# Patient Record
Sex: Male | Born: 1935 | Race: White | Hispanic: No | Marital: Married | Smoking: Former smoker
Health system: Southern US, Community
[De-identification: ages and names within clinical notes are randomized; demographics above are authoritative.]

## PROBLEM LIST (undated history)

## (undated) DIAGNOSIS — E785 Hyperlipidemia, unspecified: Secondary | ICD-10-CM

## (undated) DIAGNOSIS — I4891 Unspecified atrial fibrillation: Secondary | ICD-10-CM

## (undated) DIAGNOSIS — I10 Essential (primary) hypertension: Secondary | ICD-10-CM

## (undated) DIAGNOSIS — N186 End stage renal disease: Secondary | ICD-10-CM

## (undated) DIAGNOSIS — D649 Anemia, unspecified: Secondary | ICD-10-CM

## (undated) DIAGNOSIS — Z992 Dependence on renal dialysis: Secondary | ICD-10-CM

## (undated) DIAGNOSIS — C801 Malignant (primary) neoplasm, unspecified: Secondary | ICD-10-CM

## (undated) DIAGNOSIS — Z87442 Personal history of urinary calculi: Secondary | ICD-10-CM

## (undated) DIAGNOSIS — Z950 Presence of cardiac pacemaker: Secondary | ICD-10-CM

## (undated) HISTORY — DX: Malignant (primary) neoplasm, unspecified: C80.1

## (undated) HISTORY — DX: Presence of cardiac pacemaker: Z95.0

## (undated) HISTORY — DX: Hyperlipidemia, unspecified: E78.5

## (undated) HISTORY — DX: Unspecified atrial fibrillation: I48.91

## (undated) HISTORY — DX: Essential (primary) hypertension: I10

## (undated) HISTORY — PX: PERMANENT PACEMAKER INSERTION: SHX6023

## (undated) HISTORY — DX: Anemia, unspecified: D64.9

## (undated) HISTORY — PX: TONSILLECTOMY: SUR1361

## (undated) HISTORY — PX: APPENDECTOMY: SHX54

## (undated) HISTORY — PX: INSERT / REPLACE / REMOVE PACEMAKER: SUR710

## (undated) HISTORY — PX: CHOLECYSTECTOMY: SHX55

## (undated) HISTORY — PX: BLADDER REMOVAL: SHX567

## (undated) HISTORY — PX: TONSILLECTOMY: SHX5217

## (undated) HISTORY — PX: REVISION OF SCAR ON FACE/HEAD: SHX2349

---

## 2014-09-19 ENCOUNTER — Other Ambulatory Visit: Payer: Self-pay

## 2014-09-19 ENCOUNTER — Encounter: Payer: Self-pay | Admitting: Vascular Surgery

## 2014-09-19 DIAGNOSIS — N185 Chronic kidney disease, stage 5: Secondary | ICD-10-CM

## 2014-09-19 DIAGNOSIS — Z01810 Encounter for preprocedural cardiovascular examination: Secondary | ICD-10-CM

## 2014-09-23 ENCOUNTER — Encounter: Payer: Self-pay | Admitting: Vascular Surgery

## 2014-09-24 ENCOUNTER — Encounter: Payer: Self-pay | Admitting: Vascular Surgery

## 2014-09-25 ENCOUNTER — Ambulatory Visit (INDEPENDENT_AMBULATORY_CARE_PROVIDER_SITE_OTHER): Payer: Medicare HMO | Admitting: Vascular Surgery

## 2014-09-25 ENCOUNTER — Encounter: Payer: Self-pay | Admitting: Vascular Surgery

## 2014-09-25 ENCOUNTER — Ambulatory Visit (INDEPENDENT_AMBULATORY_CARE_PROVIDER_SITE_OTHER)
Admission: RE | Admit: 2014-09-25 | Discharge: 2014-09-25 | Disposition: A | Discharge status: Home / Self Care | Payer: Medicare HMO | Source: Ambulatory Visit | Attending: Vascular Surgery | Admitting: Vascular Surgery

## 2014-09-25 ENCOUNTER — Ambulatory Visit (HOSPITAL_COMMUNITY)
Admission: RE | Admit: 2014-09-25 | Discharge: 2014-09-25 | Disposition: A | Discharge status: Home / Self Care | Payer: Medicare HMO | Source: Ambulatory Visit | Attending: Vascular Surgery | Admitting: Vascular Surgery

## 2014-09-25 VITALS — BP 146/82 | HR 79 | Ht 67.0 in | Wt 171.0 lb

## 2014-09-25 DIAGNOSIS — Z01810 Encounter for preprocedural cardiovascular examination: Secondary | ICD-10-CM

## 2014-09-25 DIAGNOSIS — N185 Chronic kidney disease, stage 5: Secondary | ICD-10-CM | POA: Diagnosis not present

## 2014-09-25 NOTE — Progress Notes (Signed)
[facility] [place] HISTORY AND PHYSICAL   History of Present Illness:  Patient is a 81 y.o. year old male who presents for placement of a permanent hemodialysis access. The patient is right handed.  The patient is not currently on hemodialysis.  The cause of renal failure is thought to be secondary to diabetes and hypertension.  Other chronic medical problems include history of a left-sided pacemaker, hyperlipidemia, atrial fibrillation all of which is currently stable.  Past Medical History  Diagnosis Date  . Chronic kidney disease   . Hypertension   . Anemia   . Pacemaker   . Cancer     bladder -   . Hyperlipidemia   . Nephrolithiasis   . Atrial fibrillation     Past Surgical History  Procedure Laterality Date  . Tonsillectomy    . Bladder removal    . Permanent pacemaker insertion       Social History History  Substance Use Topics  . Smoking status: Former Smoker    Quit date: 04/19/1980  . Smokeless tobacco: Not on file  . Alcohol Use: 0.0 oz/week    0 Standard drinks or equivalent per week    Family History Family History  Problem Relation Age of Onset  . Stroke Mother   . Hypertension Mother   . Varicose Veins Mother   . Heart disease Mother   . Heart attack Father     Allergies  No Known Allergies   Current Outpatient Prescriptions  Medication Sig Dispense Refill  . amLODipine (NORVASC) 10 MG tablet Take 10 mg by mouth daily.    Marland Kitchen aspirin EC 81 MG tablet Take 81 mg by mouth daily.    Marland Kitchen atorvastatin (LIPITOR) 10 MG tablet Take 10 mg by mouth daily.    . calcitRIOL (ROCALTROL) 0.25 MCG capsule Take 0.25 mcg by mouth daily.    . darbepoetin (ARANESP) 60 MCG/0.3ML SOLN injection Inject 60 mcg into the skin every 7 (seven) days.    . furosemide (LASIX) 40 MG tablet Take 40 mg by mouth daily.    . metolazone (ZAROXOLYN) 5 MG tablet Take 5 mg by mouth daily.    . metoprolol succinate (TOPROL-XL) 25 MG 24 hr tablet Take 25 mg by mouth  2 (two) times daily.    . sodium bicarbonate 650 MG tablet Take 650 mg by mouth 3 (three) times daily.    . temazepam (RESTORIL) 15 MG capsule Take 15 mg by mouth at bedtime as needed for sleep.    Marland Kitchen triamcinolone cream (KENALOG) 0.1 % Apply 1 application topically 2 (two) times daily.     No current facility-administered medications for this visit.    ROS:   General:  No weight loss, Fever, chills  HEENT: No recent headaches, no nasal bleeding, no visual changes, no sore throat  Neurologic: No dizziness, blackouts, seizures. No recent symptoms of stroke or mini- stroke. No recent episodes of slurred speech, or temporary blindness.  Cardiac: No recent episodes of chest pain/pressure, no shortness of breath at rest.  + shortness of breath with exertion.  Denies history of atrial fibrillation or irregular heartbeat  Vascular: No history of rest pain in feet.  No history of claudication.  No history of non-healing ulcer, No history of DVT   Pulmonary: No home oxygen, no productive cough, no hemoptysis,  No asthma or wheezing  Musculoskeletal:  [ ]  Arthritis, [ ]  Low back pain,  [ ]  Joint pain  Hematologic:No history of hypercoagulable state.  No history of easy bleeding.  No history of anemia  Gastrointestinal: No hematochezia or melena,  No gastroesophageal reflux, no trouble swallowing  Urinary: [ x] chronic Kidney disease, [ ]  on HD - [ ]  MWF or [ ]  TTHS, [ ]  Burning with urination, [ ]  Frequent urination, [ ]  Difficulty urinating;   Skin: No rashes  Psychological: No history of anxiety,  No history of depression   Physical Examination  Filed Vitals:   09/25/14 1220  BP: 146/82  Pulse: 79  Height: 5\' 7"  (1.702 m)  Weight: 77.565 kg (171 lb)  SpO2: 97%    Body mass index is 26.78 kg/(m^2).  General:  Alert and oriented, no acute distress, patient smells uremic HEENT: Normal Neck: No bruit or JVD Pulmonary: Clear to auscultation bilaterally Cardiac: Regular Rate and  Rhythm without murmur Gastrointestinal: Soft, non-tender, non-distended, no mass Skin: No rash Extremity Pulses:  2+ radial, brachial pulses bilaterally Musculoskeletal: No deformity or edema  Neurologic: Upper and lower extremity motor 5/5 and symmetric  DATA: Vein mapping ultrasound was performed today I reviewed and interpreted this study. Patient has very small basilic and cephalic veins on the right side not usable for a fistula. The cephalic and basilic veins in the left side are also fairly marginal.   ASSESSMENT: Patient with CK D5. He needs long-term hemodialysis access. Since he has a pacemaker on the left side even though he may have a marginal vein on that side that could possibly be used for fistula more he may develop venous hypertension due to his left-sided pacemaker.. Believe the best option at this point would be to place a right arm AV graft. This is scheduled for 10/01/2014. Risks benefits possible complications and procedure details were expanded the patient today including but not limited to bleeding infection graft thrombosis ischemic steal. He understands and agrees to proceed.   PLAN:  See above  Ruta Hinds, MD [facility] [place] Office: [phone] Pager: [phone]

## 2014-09-26 ENCOUNTER — Other Ambulatory Visit: Payer: Self-pay

## 2014-09-27 ENCOUNTER — Encounter: Payer: Self-pay | Admitting: Nephrology

## 2014-09-27 ENCOUNTER — Encounter (HOSPITAL_COMMUNITY): Payer: Medicare HMO

## 2014-09-30 ENCOUNTER — Encounter (HOSPITAL_COMMUNITY): Payer: Self-pay | Admitting: *Deleted

## 2014-09-30 NOTE — Progress Notes (Signed)
   09/30/14 1303  OBSTRUCTIVE SLEEP APNEA  Have you ever been diagnosed with sleep apnea through a sleep study? No  Do you snore loudly (loud enough to be heard through closed doors)?  0  Do you often feel tired, fatigued, or sleepy during the daytime? 0  Has anyone observed you stop breathing during your sleep? 0  Do you have, or are you being treated for high blood pressure? 1  BMI more than 35 kg/m2? 0  Age over 80 years old? 1  Neck circumference greater than 40 cm/16 inches? 1  Gender: 1  Obstructive Sleep Apnea Score 4

## 2014-10-01 ENCOUNTER — Ambulatory Visit (HOSPITAL_COMMUNITY): Payer: Medicare HMO | Admitting: Certified Registered"

## 2014-10-01 ENCOUNTER — Encounter (HOSPITAL_COMMUNITY)
Admission: RE | Disposition: A | Discharge status: Home / Self Care | Payer: Self-pay | Source: Ambulatory Visit | Attending: Vascular Surgery

## 2014-10-01 ENCOUNTER — Telehealth: Payer: Self-pay | Admitting: Vascular Surgery

## 2014-10-01 ENCOUNTER — Ambulatory Visit (HOSPITAL_COMMUNITY)
Admission: RE | Admit: 2014-10-01 | Discharge: 2014-10-01 | Disposition: A | Discharge status: Home / Self Care | Payer: Medicare HMO | Source: Ambulatory Visit | Attending: Vascular Surgery | Admitting: Vascular Surgery

## 2014-10-01 ENCOUNTER — Encounter (HOSPITAL_COMMUNITY): Payer: Self-pay | Admitting: Certified Registered"

## 2014-10-01 DIAGNOSIS — I4891 Unspecified atrial fibrillation: Secondary | ICD-10-CM | POA: Diagnosis not present

## 2014-10-01 DIAGNOSIS — E785 Hyperlipidemia, unspecified: Secondary | ICD-10-CM | POA: Diagnosis not present

## 2014-10-01 DIAGNOSIS — I509 Heart failure, unspecified: Secondary | ICD-10-CM | POA: Insufficient documentation

## 2014-10-01 DIAGNOSIS — I120 Hypertensive chronic kidney disease with stage 5 chronic kidney disease or end stage renal disease: Secondary | ICD-10-CM | POA: Diagnosis not present

## 2014-10-01 DIAGNOSIS — N186 End stage renal disease: Secondary | ICD-10-CM | POA: Insufficient documentation

## 2014-10-01 DIAGNOSIS — Z7982 Long term (current) use of aspirin: Secondary | ICD-10-CM | POA: Insufficient documentation

## 2014-10-01 DIAGNOSIS — N185 Chronic kidney disease, stage 5: Secondary | ICD-10-CM | POA: Diagnosis not present

## 2014-10-01 DIAGNOSIS — Z7952 Long term (current) use of systemic steroids: Secondary | ICD-10-CM | POA: Insufficient documentation

## 2014-10-01 DIAGNOSIS — Z87891 Personal history of nicotine dependence: Secondary | ICD-10-CM | POA: Insufficient documentation

## 2014-10-01 DIAGNOSIS — I252 Old myocardial infarction: Secondary | ICD-10-CM | POA: Insufficient documentation

## 2014-10-01 DIAGNOSIS — Z79899 Other long term (current) drug therapy: Secondary | ICD-10-CM | POA: Insufficient documentation

## 2014-10-01 DIAGNOSIS — Z8551 Personal history of malignant neoplasm of bladder: Secondary | ICD-10-CM | POA: Insufficient documentation

## 2014-10-01 DIAGNOSIS — Z950 Presence of cardiac pacemaker: Secondary | ICD-10-CM | POA: Insufficient documentation

## 2014-10-01 HISTORY — PX: AV FISTULA PLACEMENT: SHX1204

## 2014-10-01 LAB — POCT I-STAT 4, (NA,K, GLUC, HGB,HCT)
Glucose, Bld: 113 mg/dL — ABNORMAL HIGH (ref 65–99)
HCT: 29 % — ABNORMAL LOW (ref 39.0–52.0)
Hemoglobin: 9.9 g/dL — ABNORMAL LOW (ref 13.0–17.0)
Potassium: 4.1 mmol/L (ref 3.5–5.1)
SODIUM: 141 mmol/L (ref 135–145)

## 2014-10-01 SURGERY — INSERTION OF ARTERIOVENOUS (AV) GORE-TEX GRAFT ARM
Anesthesia: Monitor Anesthesia Care | Site: Arm Upper | Laterality: Right

## 2014-10-01 MED ORDER — SODIUM CHLORIDE 0.9 % IV SOLN
INTRAVENOUS | Status: DC
  Administered 2014-10-01 (×2): via INTRAVENOUS

## 2014-10-01 MED ORDER — LIDOCAINE HCL (PF) 1 % IJ SOLN
INTRAMUSCULAR | Status: AC
  Filled 2014-10-01: qty 30

## 2014-10-01 MED ORDER — FENTANYL CITRATE (PF) 100 MCG/2ML IJ SOLN
INTRAMUSCULAR | Status: AC
  Filled 2014-10-01: qty 2

## 2014-10-01 MED ORDER — THROMBIN 20000 UNITS EX SOLR
CUTANEOUS | Status: AC
  Filled 2014-10-01: qty 20000

## 2014-10-01 MED ORDER — OXYCODONE HCL 5 MG/5ML PO SOLN: 5.0000 mg | Freq: Once | ORAL | Status: DC | PRN

## 2014-10-01 MED ORDER — FENTANYL CITRATE (PF) 100 MCG/2ML IJ SOLN
25.0000 ug | INTRAMUSCULAR | Status: DC | PRN
  Administered 2014-10-01: 25 ug via INTRAVENOUS

## 2014-10-01 MED ORDER — DEXTROSE 5 % IV SOLN
1.5000 g | INTRAVENOUS | Status: AC
  Administered 2014-10-01: 1.5 g via INTRAVENOUS

## 2014-10-01 MED ORDER — CHLORHEXIDINE GLUCONATE CLOTH 2 % EX PADS: 6.0000 | MEDICATED_PAD | Freq: Once | CUTANEOUS | Status: DC

## 2014-10-01 MED ORDER — METOPROLOL TARTRATE 25 MG PO TABS
25.0000 mg | ORAL_TABLET | Freq: Once | ORAL | Status: AC
  Administered 2014-10-01: 25 mg via ORAL
  Filled 2014-10-01: qty 1

## 2014-10-01 MED ORDER — FENTANYL CITRATE (PF) 250 MCG/5ML IJ SOLN
INTRAMUSCULAR | Status: AC
  Filled 2014-10-01: qty 5

## 2014-10-01 MED ORDER — PROPOFOL INFUSION 10 MG/ML OPTIME
INTRAVENOUS | Status: DC | PRN
  Administered 2014-10-01: 75 ug/kg/min via INTRAVENOUS

## 2014-10-01 MED ORDER — FENTANYL CITRATE (PF) 100 MCG/2ML IJ SOLN
INTRAMUSCULAR | Status: DC | PRN
  Administered 2014-10-01: 50 ug via INTRAVENOUS

## 2014-10-01 MED ORDER — THROMBIN 20000 UNITS EX SOLR
CUTANEOUS | Status: DC | PRN
  Administered 2014-10-01: 15:00:00 via TOPICAL

## 2014-10-01 MED ORDER — LIDOCAINE HCL (PF) 1 % IJ SOLN
INTRAMUSCULAR | Status: DC | PRN
  Administered 2014-10-01: 15 mL

## 2014-10-01 MED ORDER — PROPOFOL 10 MG/ML IV BOLUS
INTRAVENOUS | Status: AC
  Filled 2014-10-01: qty 20

## 2014-10-01 MED ORDER — DEXTROSE 5 % IV SOLN
INTRAVENOUS | Status: AC
  Filled 2014-10-01: qty 1.5

## 2014-10-01 MED ORDER — OXYCODONE-ACETAMINOPHEN 5-325 MG PO TABS: 1.0000 | ORAL_TABLET | Freq: Four times a day (QID) | ORAL | Status: DC | PRN

## 2014-10-01 MED ORDER — OXYCODONE HCL 5 MG PO TABS: 5.0000 mg | ORAL_TABLET | Freq: Once | ORAL | Status: DC | PRN

## 2014-10-01 MED ORDER — HEPARIN SODIUM (PORCINE) 1000 UNIT/ML IJ SOLN
INTRAMUSCULAR | Status: DC | PRN
  Administered 2014-10-01: 5000 [IU] via INTRAVENOUS

## 2014-10-01 MED ORDER — ROCURONIUM BROMIDE 50 MG/5ML IV SOLN
INTRAVENOUS | Status: AC
  Filled 2014-10-01: qty 1

## 2014-10-01 MED ORDER — METOPROLOL TARTRATE 12.5 MG HALF TABLET
ORAL_TABLET | ORAL | Status: AC
  Filled 2014-10-01: qty 2

## 2014-10-01 MED ORDER — PROTAMINE SULFATE 10 MG/ML IV SOLN
INTRAVENOUS | Status: DC | PRN
  Administered 2014-10-01 (×2): 20 mg via INTRAVENOUS
  Administered 2014-10-01: 10 mg via INTRAVENOUS

## 2014-10-01 MED ORDER — FENTANYL CITRATE (PF) 100 MCG/2ML IJ SOLN: 25.0000 ug | INTRAMUSCULAR | Status: DC | PRN

## 2014-10-01 MED ORDER — SODIUM CHLORIDE 0.9 % IR SOLN
Status: DC | PRN
  Administered 2014-10-01: 500 mL

## 2014-10-01 MED ORDER — 0.9 % SODIUM CHLORIDE (POUR BTL) OPTIME
TOPICAL | Status: DC | PRN
  Administered 2014-10-01: 1000 mL

## 2014-10-01 SURGICAL SUPPLY — 39 items
ARMBAND PINK RESTRICT EXTREMIT (MISCELLANEOUS) ×2 IMPLANT
CANISTER SUCTION 2500CC (MISCELLANEOUS) ×2 IMPLANT
CANNULA VESSEL 3MM 2 BLNT TIP (CANNULA) IMPLANT
CLIP TI MEDIUM 6 (CLIP) ×2 IMPLANT
CLIP TI WIDE RED SMALL 6 (CLIP) ×2 IMPLANT
DECANTER SPIKE VIAL GLASS SM (MISCELLANEOUS) ×2 IMPLANT
ELECT REM PT RETURN 9FT ADLT (ELECTROSURGICAL) ×2
ELECTRODE REM PT RTRN 9FT ADLT (ELECTROSURGICAL) ×1 IMPLANT
GAUZE SPONGE 4X4 16PLY XRAY LF (GAUZE/BANDAGES/DRESSINGS) ×2 IMPLANT
GEL ULTRASOUND 20GR AQUASONIC (MISCELLANEOUS) IMPLANT
GLOVE BIO SURGEON STRL SZ7.5 (GLOVE) ×2 IMPLANT
GLOVE BIOGEL PI IND STRL 7.0 (GLOVE) ×1 IMPLANT
GLOVE BIOGEL PI IND STRL 7.5 (GLOVE) ×1 IMPLANT
GLOVE BIOGEL PI INDICATOR 7.0 (GLOVE) ×1
GLOVE BIOGEL PI INDICATOR 7.5 (GLOVE) ×1
GLOVE ECLIPSE 6.5 STRL STRAW (GLOVE) ×2 IMPLANT
GLOVE ECLIPSE 7.0 STRL STRAW (GLOVE) ×2 IMPLANT
GLOVE SURG SS PI 7.0 STRL IVOR (GLOVE) ×2 IMPLANT
GOWN STRL REUS W/ TWL LRG LVL3 (GOWN DISPOSABLE) ×3 IMPLANT
GOWN STRL REUS W/ TWL XL LVL3 (GOWN DISPOSABLE) ×1 IMPLANT
GOWN STRL REUS W/TWL LRG LVL3 (GOWN DISPOSABLE) ×3
GOWN STRL REUS W/TWL XL LVL3 (GOWN DISPOSABLE) ×1
GRAFT GORETEX 6X40 (Vascular Products) ×2 IMPLANT
KIT BASIN OR (CUSTOM PROCEDURE TRAY) ×2 IMPLANT
KIT ROOM TURNOVER OR (KITS) ×2 IMPLANT
LIQUID BAND (GAUZE/BANDAGES/DRESSINGS) ×2 IMPLANT
LOOP VESSEL MINI RED (MISCELLANEOUS) IMPLANT
NS IRRIG 1000ML POUR BTL (IV SOLUTION) ×2 IMPLANT
PACK CV ACCESS (CUSTOM PROCEDURE TRAY) ×2 IMPLANT
PAD ARMBOARD 7.5X6 YLW CONV (MISCELLANEOUS) ×4 IMPLANT
SPONGE SURGIFOAM ABS GEL 100 (HEMOSTASIS) ×2 IMPLANT
SUT PROLENE 6 0 CC (SUTURE) ×6 IMPLANT
SUT SILK 3 0 (SUTURE) ×1
SUT SILK 3-0 18XBRD TIE 12 (SUTURE) ×1 IMPLANT
SUT VIC AB 3-0 SH 27 (SUTURE) ×2
SUT VIC AB 3-0 SH 27X BRD (SUTURE) ×2 IMPLANT
SUT VICRYL 4-0 PS2 18IN ABS (SUTURE) ×2 IMPLANT
UNDERPAD 30X30 INCONTINENT (UNDERPADS AND DIAPERS) ×2 IMPLANT
WATER STERILE IRR 1000ML POUR (IV SOLUTION) ×2 IMPLANT

## 2014-10-01 NOTE — Transfer of Care (Signed)
Immediate Anesthesia Transfer of Care Note  Patient: Robert Morgan  Procedure(s) Performed: Procedure(s): INSERTION OF ARTERIOVENOUS (AV) GORE-TEX GRAFT RIGHT ARM (Right)  Patient Location: PACU  Anesthesia Type:MAC  Level of Consciousness: awake, alert , oriented and patient cooperative  Airway & Oxygen Therapy: Patient Spontanous Breathing and Patient connected to nasal cannula oxygen  Post-op Assessment: Report given to RN, Post -op Vital signs reviewed and stable and Patient moving all extremities  Post vital signs: Reviewed and stable  Last Vitals:  Filed Vitals:   10/01/14 1541  BP:   Pulse:   Temp: 36.9 C  Resp:     Complications: No apparent anesthesia complications

## 2014-10-01 NOTE — Op Note (Signed)
Procedure: Right Upper Arm AV graft  Preop: ESRD  Postop: ESRD  Anesthesia: Local with IV sedation  Assistant: Gerri Lins, PA-C, Silva Bandy, PA-c  Findings:  6 mm PTFE end to side to axillary vein   Procedure Details: The right upper extremity was prepped and draped in usual sterile fashion.  A longitudinal incision was then made near the antecubital crease the right arm. The incision was carried into the subcutaneous tissues down to level of the brachial artery.  Next the brachial artery was dissected free in the medial portion incision. The artery was  3-4 mm in diameter. The vessel loops were placed proximal and distal to the planned site of arteriotomy. At this point, a longitudinal incision was made in the axilla and carried through the subcutaneous tissues and fascia to expose the axillary vein.  The nerves were protected.  The vein was approximately 4 mm in diameter.  It was dissected free and small side branches ligated with silk ties.  Next, a subcutaneous tunnel was created connecting the upper arm to the lower arm incision in an arcing configuration over the biceps muscle.  A 6 mm PTFE graft was then brought through this subcutaneous tunnel. The patient was given 5000 units of intravenous heparin. After appropriate circulation time, the vessel loops were used to control the artery. A longitudinal opening was made in the left brachial artery.  The end of the graft was beveled and sewn end to side to the artery using a 6 0 prolene.  At completion of the anastomosis the artery was forward bled, backbled and thoroughly flushed.  The anastomosis was secured, vessel loops were released and there was palpable pulse in the graft.  The graft was clamped just above the arterial anastomosis with a fistula clamp. The graft was then pulled taut to length at the axillary incision.  The axillary vein was controlled with a fine bulldog clamp proximally and distally.  The distal end of the graft was then  beveled and sewn end to side to the vein using a running 6 0 prolene.  Just prior to completion of the anastomosis, everything was forward bled, back bled and thoroughly flushed.  The anastomosis was secured and the fistula clamp removed from the proximal graft.  A thrill was immediately palpable in the graft. The patient was given 70 mg of protamine to assist with hemostasis.  After hemostasis was obtained, the subcutaneous tissues were reapproximated using a running 3-0 Vicryl suture. The skin was then closed with a 4 0 Vicryl subcuticular stitch. Dermabond was applied to the skin incisions.  The patient tolerated the procedure well and there were no complications.  Instrument sponge and needle count was correct at the end of the case.  The patient was taken to the recovery room in stable condition.  The patient had a palpable pulse at the end of the case.  Ruta Hinds, MD [facility] [place] Office: [phone] Pager: [phone]

## 2014-10-01 NOTE — Discharge Instructions (Signed)
° ° °  10/01/2014 Robert Morgan [id number] 07-17-1927  Surgeon(s): Elam Dutch, MD  Procedure(s): INSERTION OF ARTERIOVENOUS (AV) GORE-TEX GRAFT RIGHT ARM  x Do not stick graft for 4 weeks

## 2014-10-01 NOTE — Anesthesia Preprocedure Evaluation (Addendum)
Anesthesia Evaluation  Patient identified by MRN, date of birth, ID band Patient awake    Reviewed: Allergy & Precautions, NPO status , Patient's Chart, lab work & pertinent test results, reviewed documented beta blocker date and time   History of Anesthesia Complications Negative for: history of anesthetic complications  Airway Mallampati: III  TM Distance: <3 FB Neck ROM: Limited    Dental  (+) Partial Lower, Partial Upper   Pulmonary neg shortness of breath, neg sleep apnea, neg COPDneg recent URI, former smoker,  breath sounds clear to auscultation        Cardiovascular hypertension, Pt. on medications and Pt. on home beta blockers - angina- Past MI and - CHF + pacemaker Rhythm:Regular     Neuro/Psych negative neurological ROS  negative psych ROS   GI/Hepatic negative GI ROS, Neg liver ROS,   Endo/Other  negative endocrine ROS  Renal/GU CRFRenal disease     Musculoskeletal   Abdominal   Peds  Hematology  (+) anemia ,   Anesthesia Other Findings   Reproductive/Obstetrics                           Anesthesia Physical Anesthesia Plan  ASA: III  Anesthesia Plan: MAC   Post-op Pain Management:    Induction: Intravenous  Airway Management Planned: Nasal Cannula  Additional Equipment: None  Intra-op Plan:   Post-operative Plan:   Informed Consent: I have reviewed the patients History and Physical, chart, labs and discussed the procedure including the risks, benefits and alternatives for the proposed anesthesia with the patient or authorized representative who has indicated his/her understanding and acceptance.   Dental advisory given  Plan Discussed with: CRNA and Surgeon  Anesthesia Plan Comments:         Anesthesia Quick Evaluation

## 2014-10-01 NOTE — H&P (View-Only) (Signed)
[facility] [place] [facility] HISTORY AND PHYSICAL   History of Present Illness:  Patient is a 81 y.o. year old male who presents for placement of a permanent hemodialysis access. The patient is right handed.  The patient is not currently on hemodialysis.  The cause of renal failure is thought to be secondary to diabetes and hypertension.  Other chronic medical problems include history of a left-sided pacemaker, hyperlipidemia, atrial fibrillation all of which is currently stable.  Past Medical History  Diagnosis Date  . Chronic kidney disease   . Hypertension   . Anemia   . Pacemaker   . Cancer     bladder -   . Hyperlipidemia   . Nephrolithiasis   . Atrial fibrillation     Past Surgical History  Procedure Laterality Date  . Tonsillectomy    . Bladder removal    . Permanent pacemaker insertion       Social History History  Substance Use Topics  . Smoking status: Former Smoker    Quit date: 04/19/1980  . Smokeless tobacco: Not on file  . Alcohol Use: 0.0 oz/week    0 Standard drinks or equivalent per week    Family History Family History  Problem Relation Age of Onset  . Stroke Mother   . Hypertension Mother   . Varicose Veins Mother   . Heart disease Mother   . Heart attack Father     Allergies  No Known Allergies   Current Outpatient Prescriptions  Medication Sig Dispense Refill  . amLODipine (NORVASC) 10 MG tablet Take 10 mg by mouth daily.    Marland Kitchen aspirin EC 81 MG tablet Take 81 mg by mouth daily.    Marland Kitchen atorvastatin (LIPITOR) 10 MG tablet Take 10 mg by mouth daily.    . calcitRIOL (ROCALTROL) 0.25 MCG capsule Take 0.25 mcg by mouth daily.    . darbepoetin (ARANESP) 60 MCG/0.3ML SOLN injection Inject 60 mcg into the skin every 7 (seven) days.    . furosemide (LASIX) 40 MG tablet Take 40 mg by mouth daily.    . metolazone (ZAROXOLYN) 5 MG tablet Take 5 mg by mouth daily.    . metoprolol succinate (TOPROL-XL) 25 MG 24 hr tablet Take 25 mg by mouth  2 (two) times daily.    . sodium bicarbonate 650 MG tablet Take 650 mg by mouth 3 (three) times daily.    . temazepam (RESTORIL) 15 MG capsule Take 15 mg by mouth at bedtime as needed for sleep.    Marland Kitchen triamcinolone cream (KENALOG) 0.1 % Apply 1 application topically 2 (two) times daily.     No current facility-administered medications for this visit.    ROS:   General:  No weight loss, Fever, chills  HEENT: No recent headaches, no nasal bleeding, no visual changes, no sore throat  Neurologic: No dizziness, blackouts, seizures. No recent symptoms of stroke or mini- stroke. No recent episodes of slurred speech, or temporary blindness.  Cardiac: No recent episodes of chest pain/pressure, no shortness of breath at rest.  + shortness of breath with exertion.  Denies history of atrial fibrillation or irregular heartbeat  Vascular: No history of rest pain in feet.  No history of claudication.  No history of non-healing ulcer, No history of DVT   Pulmonary: No home oxygen, no productive cough, no hemoptysis,  No asthma or wheezing  Musculoskeletal:  [ ]  Arthritis, [ ]  Low back pain,  [ ]  Joint pain  Hematologic:No history of hypercoagulable state.  No history of easy bleeding.  No history of anemia  Gastrointestinal: No hematochezia or melena,  No gastroesophageal reflux, no trouble swallowing  Urinary: [ x] chronic Kidney disease, [ ]  on HD - [ ]  MWF or [ ]  TTHS, [ ]  Burning with urination, [ ]  Frequent urination, [ ]  Difficulty urinating;   Skin: No rashes  Psychological: No history of anxiety,  No history of depression   Physical Examination  Filed Vitals:   09/25/14 1220  BP: 146/82  Pulse: 79  Height: 5\' 7"  (1.702 m)  Weight: 77.565 kg (171 lb)  SpO2: 97%    Body mass index is 26.78 kg/(m^2).  General:  Alert and oriented, no acute distress, patient smells uremic HEENT: Normal Neck: No bruit or JVD Pulmonary: Clear to auscultation bilaterally Cardiac: Regular Rate and  Rhythm without murmur Gastrointestinal: Soft, non-tender, non-distended, no mass Skin: No rash Extremity Pulses:  2+ radial, brachial pulses bilaterally Musculoskeletal: No deformity or edema  Neurologic: Upper and lower extremity motor 5/5 and symmetric  DATA: Vein mapping ultrasound was performed today I reviewed and interpreted this study. Patient has very small basilic and cephalic veins on the right side not usable for a fistula. The cephalic and basilic veins in the left side are also fairly marginal.   ASSESSMENT: Patient with CK D5. He needs long-term hemodialysis access. Since he has a pacemaker on the left side even though he may have a marginal vein on that side that could possibly be used for fistula more he may develop venous hypertension due to his left-sided pacemaker.. Believe the best option at this point would be to place a right arm AV graft. This is scheduled for 10/01/2014. Risks benefits possible complications and procedure details were expanded the patient today including but not limited to bleeding infection graft thrombosis ischemic steal. He understands and agrees to proceed.   PLAN:  See above  Ruta Hinds, MD [facility] Office: [phone] Pager: [phone]

## 2014-10-01 NOTE — Interval H&P Note (Signed)
History and Physical Interval Note:  10/01/2014 1:16 PM  Robert Morgan  has presented today for surgery, with the diagnosis of End Stage Renal Disease  N18.6  The various methods of treatment have been discussed with the patient and family. After consideration of risks, benefits and other options for treatment, the patient has consented to  Procedure(s): INSERTION OF ARTERIOVENOUS (AV) GORE-TEX GRAFT ARM-RIGHT (Right) as a surgical intervention .  The patient's history has been reviewed, patient examined, no change in status, stable for surgery.  I have reviewed the patient's chart and labs.  Questions were answered to the patient's satisfaction.     Ruta Hinds

## 2014-10-01 NOTE — Telephone Encounter (Addendum)
-----   Message from Mena Goes, RN sent at 10/01/2014  3:41 PM EDT ----- Regarding: Schedule    ----- Message -----    From: Alvia Grove, PA-C    Sent: 10/01/2014   3:25 PM      To: [facility] Charge Pool  S/p right AV graft insertion 10/01/14  F/u with Dr. Oneida Alar in 2 weeks  Thanks Maudie Mercury  l/v/m notifying patient of post op appt. with dr. Oneida Alar on 10-24-14 at 1pm

## 2014-10-02 ENCOUNTER — Encounter (HOSPITAL_COMMUNITY): Payer: Self-pay | Admitting: Vascular Surgery

## 2014-10-02 NOTE — Anesthesia Postprocedure Evaluation (Signed)
  Anesthesia Post-op Note  Patient: Robert Morgan  Procedure(s) Performed: Procedure(s): INSERTION OF ARTERIOVENOUS (AV) GORE-TEX GRAFT RIGHT ARM (Right)  Patient Location: PACU  Anesthesia Type:MAC  Level of Consciousness: awake  Airway and Oxygen Therapy: Patient Spontanous Breathing  Post-op Pain: none  Post-op Assessment: Post-op Vital signs reviewed, Patient's Cardiovascular Status Stable, Respiratory Function Stable, Patent Airway, No signs of Nausea or vomiting and Pain level controlled              Post-op Vital Signs: Reviewed and stable  Last Vitals:  Filed Vitals:   10/01/14 1641  BP:   Pulse: 69  Temp:   Resp: 16    Complications: No apparent anesthesia complications

## 2014-10-18 ENCOUNTER — Encounter: Payer: Self-pay | Admitting: Vascular Surgery

## 2014-10-24 ENCOUNTER — Encounter: Payer: Self-pay | Admitting: Vascular Surgery

## 2014-10-24 ENCOUNTER — Ambulatory Visit (INDEPENDENT_AMBULATORY_CARE_PROVIDER_SITE_OTHER): Payer: Self-pay | Admitting: Vascular Surgery

## 2014-10-24 VITALS — BP 164/69 | HR 81 | Ht 67.0 in | Wt 159.0 lb

## 2014-10-24 DIAGNOSIS — N184 Chronic kidney disease, stage 4 (severe): Secondary | ICD-10-CM

## 2014-10-24 NOTE — Progress Notes (Signed)
Patient is an 81 year old male who underwent placement of a right upper arm AV graft on June 14. He is currently not on hemodialysis. He denies any numbness or tingling in his right hand.  Physical exam:  Filed Vitals:   10/24/14 1331 10/24/14 1334  BP: 171/68 164/69  Pulse: 81   Height: 5\' 7"  (1.702 m)   Weight: 159 lb (72.122 kg)   SpO2: 96%     Well-healed right antecubital and axillary incision. Audible bruit palpable thrill in graft. No palpable radial pulse.  Assessment: Doing well status post placement of right upper arm AV graft. Plan: Graft is ready for use if needed. Patient will follow-up on as-needed basis.  Ruta Hinds, MD [facility] [place] Office: [phone] Pager: [phone]

## 2014-11-09 ENCOUNTER — Other Ambulatory Visit: Payer: Self-pay | Admitting: Internal Medicine

## 2014-11-09 ENCOUNTER — Encounter (HOSPITAL_COMMUNITY): Payer: Self-pay | Admitting: *Deleted

## 2014-11-09 ENCOUNTER — Inpatient Hospital Stay (HOSPITAL_COMMUNITY)
Admission: AD | Admit: 2014-11-09 | Discharge: 2014-11-15 | DRG: 682 | Disposition: A | Discharge status: Home / Self Care | Payer: Medicare HMO | Source: Other Acute Inpatient Hospital | Attending: Internal Medicine | Admitting: Internal Medicine

## 2014-11-09 DIAGNOSIS — R531 Weakness: Secondary | ICD-10-CM | POA: Diagnosis present

## 2014-11-09 DIAGNOSIS — N186 End stage renal disease: Secondary | ICD-10-CM | POA: Diagnosis present

## 2014-11-09 DIAGNOSIS — E785 Hyperlipidemia, unspecified: Secondary | ICD-10-CM | POA: Diagnosis present

## 2014-11-09 DIAGNOSIS — Z87891 Personal history of nicotine dependence: Secondary | ICD-10-CM

## 2014-11-09 DIAGNOSIS — I120 Hypertensive chronic kidney disease with stage 5 chronic kidney disease or end stage renal disease: Principal | ICD-10-CM | POA: Diagnosis present

## 2014-11-09 DIAGNOSIS — F4323 Adjustment disorder with mixed anxiety and depressed mood: Secondary | ICD-10-CM | POA: Clinically undetermined

## 2014-11-09 DIAGNOSIS — D631 Anemia in chronic kidney disease: Secondary | ICD-10-CM | POA: Diagnosis present

## 2014-11-09 DIAGNOSIS — I4891 Unspecified atrial fibrillation: Secondary | ICD-10-CM | POA: Diagnosis present

## 2014-11-09 DIAGNOSIS — D696 Thrombocytopenia, unspecified: Secondary | ICD-10-CM | POA: Diagnosis present

## 2014-11-09 DIAGNOSIS — Z9049 Acquired absence of other specified parts of digestive tract: Secondary | ICD-10-CM | POA: Diagnosis present

## 2014-11-09 DIAGNOSIS — Z992 Dependence on renal dialysis: Secondary | ICD-10-CM

## 2014-11-09 DIAGNOSIS — M940 Chondrocostal junction syndrome [Tietze]: Secondary | ICD-10-CM | POA: Diagnosis present

## 2014-11-09 DIAGNOSIS — N390 Urinary tract infection, site not specified: Secondary | ICD-10-CM | POA: Diagnosis present

## 2014-11-09 DIAGNOSIS — Z950 Presence of cardiac pacemaker: Secondary | ICD-10-CM | POA: Diagnosis not present

## 2014-11-09 DIAGNOSIS — I10 Essential (primary) hypertension: Secondary | ICD-10-CM | POA: Diagnosis present

## 2014-11-09 DIAGNOSIS — N189 Chronic kidney disease, unspecified: Secondary | ICD-10-CM | POA: Diagnosis not present

## 2014-11-09 DIAGNOSIS — Z936 Other artificial openings of urinary tract status: Secondary | ICD-10-CM

## 2014-11-09 DIAGNOSIS — N185 Chronic kidney disease, stage 5: Secondary | ICD-10-CM | POA: Diagnosis not present

## 2014-11-09 DIAGNOSIS — Z8679 Personal history of other diseases of the circulatory system: Secondary | ICD-10-CM

## 2014-11-09 DIAGNOSIS — Z7901 Long term (current) use of anticoagulants: Secondary | ICD-10-CM

## 2014-11-09 DIAGNOSIS — E876 Hypokalemia: Secondary | ICD-10-CM | POA: Diagnosis present

## 2014-11-09 DIAGNOSIS — Z8551 Personal history of malignant neoplasm of bladder: Secondary | ICD-10-CM | POA: Diagnosis not present

## 2014-11-09 LAB — RENAL FUNCTION PANEL
ALBUMIN: 3 g/dL — AB (ref 3.5–5.0)
Anion gap: 14 (ref 5–15)
BUN: 112 mg/dL — ABNORMAL HIGH (ref 6–20)
CALCIUM: 7.8 mg/dL — AB (ref 8.9–10.3)
CO2: 18 mmol/L — ABNORMAL LOW (ref 22–32)
Chloride: 109 mmol/L (ref 101–111)
Creatinine, Ser: 7.67 mg/dL — ABNORMAL HIGH (ref 0.61–1.24)
GFR, EST AFRICAN AMERICAN: 7 mL/min — AB (ref >60)
GFR, EST NON AFRICAN AMERICAN: 6 mL/min — AB (ref >60)
GLUCOSE: 112 mg/dL — AB (ref 65–99)
PHOSPHORUS: 7.6 mg/dL — AB (ref 2.5–4.6)
POTASSIUM: 3.5 mmol/L (ref 3.5–5.1)
SODIUM: 141 mmol/L (ref 135–145)

## 2014-11-09 LAB — RETICULOCYTES
RBC.: 3.11 MIL/uL — AB (ref 4.22–5.81)
RETIC COUNT ABSOLUTE: 40.4 10*3/uL (ref 19.0–186.0)
Retic Ct Pct: 1.3 % (ref 0.4–3.1)

## 2014-11-09 LAB — VITAMIN B12: Vitamin B-12: 450 pg/mL (ref 180–914)

## 2014-11-09 LAB — FERRITIN: Ferritin: 135 ng/mL (ref 24–336)

## 2014-11-09 LAB — SODIUM, URINE, RANDOM: SODIUM UR: 75 mmol/L

## 2014-11-09 LAB — URINALYSIS, ROUTINE W REFLEX MICROSCOPIC
BILIRUBIN URINE: NEGATIVE
Glucose, UA: NEGATIVE mg/dL
KETONES UR: 15 mg/dL — AB
Nitrite: NEGATIVE
PH: 6 (ref 5.0–8.0)
Protein, ur: >300 mg/dL — AB
SPECIFIC GRAVITY, URINE: 1.014 (ref 1.005–1.030)
UROBILINOGEN UA: 0.2 mg/dL (ref 0.0–1.0)

## 2014-11-09 LAB — CBC WITH DIFFERENTIAL/PLATELET
BASOS ABS: 0 10*3/uL (ref 0.0–0.1)
BASOS PCT: 0 % (ref 0–1)
Eosinophils Absolute: 0.1 10*3/uL (ref 0.0–0.7)
Eosinophils Relative: 2 % (ref 0–5)
HEMATOCRIT: 28.6 % — AB (ref 39.0–52.0)
Hemoglobin: 9.2 g/dL — ABNORMAL LOW (ref 13.0–17.0)
Lymphocytes Relative: 13 % (ref 12–46)
Lymphs Abs: 0.7 10*3/uL (ref 0.7–4.0)
MCH: 29.6 pg (ref 26.0–34.0)
MCHC: 32.2 g/dL (ref 30.0–36.0)
MCV: 92 fL (ref 78.0–100.0)
Monocytes Absolute: 0.5 10*3/uL (ref 0.1–1.0)
Monocytes Relative: 10 % (ref 3–12)
NEUTROS ABS: 3.9 10*3/uL (ref 1.7–7.7)
NEUTROS PCT: 75 % (ref 43–77)
Platelets: 101 10*3/uL — ABNORMAL LOW (ref 150–400)
RBC: 3.11 MIL/uL — ABNORMAL LOW (ref 4.22–5.81)
RDW: 14.3 % (ref 11.5–15.5)
WBC: 5.2 10*3/uL (ref 4.0–10.5)

## 2014-11-09 LAB — URINE MICROSCOPIC-ADD ON

## 2014-11-09 LAB — IRON AND TIBC
IRON: 28 ug/dL — AB (ref 45–182)
Saturation Ratios: 14 % — ABNORMAL LOW (ref 17.9–39.5)
TIBC: 200 ug/dL — ABNORMAL LOW (ref 250–450)
UIBC: 172 ug/dL

## 2014-11-09 LAB — CREATININE, URINE, RANDOM: CREATININE, URINE: 76.1 mg/dL

## 2014-11-09 LAB — FOLATE: FOLATE: 11.8 ng/mL (ref >5.9)

## 2014-11-09 MED ORDER — CALCITRIOL 0.5 MCG PO CAPS
0.5000 ug | ORAL_CAPSULE | Freq: Every day | ORAL | Status: DC
  Administered 2014-11-10 – 2014-11-15 (×6): 0.5 ug via ORAL
  Filled 2014-11-09 (×5): qty 1

## 2014-11-09 MED ORDER — HEPARIN SODIUM (PORCINE) 1000 UNIT/ML DIALYSIS: 1000.0000 [IU] | INTRAMUSCULAR | Status: DC | PRN

## 2014-11-09 MED ORDER — HEPARIN SODIUM (PORCINE) 1000 UNIT/ML DIALYSIS
100.0000 [IU]/kg | INTRAMUSCULAR | Status: DC | PRN
  Filled 2014-11-09: qty 8

## 2014-11-09 MED ORDER — TEMAZEPAM 15 MG PO CAPS
15.0000 mg | ORAL_CAPSULE | Freq: Every evening | ORAL | Status: DC | PRN
  Administered 2014-11-11 – 2014-11-15 (×4): 15 mg via ORAL
  Filled 2014-11-09 (×4): qty 1

## 2014-11-09 MED ORDER — ONDANSETRON HCL 4 MG PO TABS: 4.0000 mg | ORAL_TABLET | Freq: Four times a day (QID) | ORAL | Status: DC | PRN

## 2014-11-09 MED ORDER — DARBEPOETIN ALFA-POLYSORBATE 60 MCG/0.3ML IJ SOLN: 60.0000 ug | INTRAMUSCULAR | Status: DC

## 2014-11-09 MED ORDER — AMLODIPINE BESYLATE 2.5 MG PO TABS: 2.5000 mg | ORAL_TABLET | Freq: Every day | ORAL | Status: DC

## 2014-11-09 MED ORDER — METOPROLOL TARTRATE 12.5 MG HALF TABLET
12.5000 mg | ORAL_TABLET | Freq: Two times a day (BID) | ORAL | Status: DC
  Administered 2014-11-09 – 2014-11-14 (×10): 12.5 mg via ORAL
  Filled 2014-11-09 (×10): qty 1

## 2014-11-09 MED ORDER — ASPIRIN EC 81 MG PO TBEC
81.0000 mg | DELAYED_RELEASE_TABLET | Freq: Every day | ORAL | Status: DC
  Administered 2014-11-12 – 2014-11-15 (×3): 81 mg via ORAL
  Filled 2014-11-09 (×6): qty 1

## 2014-11-09 MED ORDER — ATORVASTATIN CALCIUM 10 MG PO TABS
10.0000 mg | ORAL_TABLET | Freq: Every day | ORAL | Status: DC
  Administered 2014-11-10 – 2014-11-15 (×6): 10 mg via ORAL
  Filled 2014-11-09 (×9): qty 1

## 2014-11-09 MED ORDER — ALTEPLASE 2 MG IJ SOLR
2.0000 mg | Freq: Once | INTRAMUSCULAR | Status: AC | PRN
  Filled 2014-11-09: qty 2

## 2014-11-09 MED ORDER — OXYCODONE-ACETAMINOPHEN 5-325 MG PO TABS
ORAL_TABLET | ORAL | Status: AC
  Administered 2014-11-09: 1 via ORAL
  Filled 2014-11-09: qty 1

## 2014-11-09 MED ORDER — CALCITRIOL 0.25 MCG PO CAPS: 0.2500 ug | ORAL_CAPSULE | Freq: Every day | ORAL | Status: DC

## 2014-11-09 MED ORDER — SODIUM CHLORIDE 0.9 % IV SOLN: 100.0000 mL | INTRAVENOUS | Status: DC | PRN

## 2014-11-09 MED ORDER — SODIUM CHLORIDE 0.9 % IJ SOLN
3.0000 mL | Freq: Two times a day (BID) | INTRAMUSCULAR | Status: DC
  Administered 2014-11-09 – 2014-11-15 (×12): 3 mL via INTRAVENOUS

## 2014-11-09 MED ORDER — DARBEPOETIN ALFA 100 MCG/0.5ML IJ SOSY
PREFILLED_SYRINGE | INTRAMUSCULAR | Status: AC
  Filled 2014-11-09: qty 0.5

## 2014-11-09 MED ORDER — LIDOCAINE HCL (PF) 1 % IJ SOLN: 5.0000 mL | INTRAMUSCULAR | Status: DC | PRN

## 2014-11-09 MED ORDER — DARBEPOETIN ALFA 100 MCG/0.5ML IJ SOSY: 100.0000 ug | PREFILLED_SYRINGE | INTRAMUSCULAR | Status: DC

## 2014-11-09 MED ORDER — DARBEPOETIN ALFA 100 MCG/0.5ML IJ SOSY
100.0000 ug | PREFILLED_SYRINGE | Freq: Once | INTRAMUSCULAR | Status: DC
  Administered 2014-11-09: 100 ug via SUBCUTANEOUS

## 2014-11-09 MED ORDER — ACETAMINOPHEN 325 MG PO TABS
650.0000 mg | ORAL_TABLET | Freq: Four times a day (QID) | ORAL | Status: DC | PRN
  Administered 2014-11-09 – 2014-11-13 (×5): 650 mg via ORAL
  Filled 2014-11-09 (×5): qty 2

## 2014-11-09 MED ORDER — OXYCODONE-ACETAMINOPHEN 5-325 MG PO TABS
1.0000 | ORAL_TABLET | Freq: Four times a day (QID) | ORAL | Status: DC | PRN
  Administered 2014-11-09 – 2014-11-13 (×4): 1 via ORAL
  Filled 2014-11-09 (×2): qty 1

## 2014-11-09 MED ORDER — HEPARIN SODIUM (PORCINE) 5000 UNIT/ML IJ SOLN
5000.0000 [IU] | Freq: Three times a day (TID) | INTRAMUSCULAR | Status: DC
  Administered 2014-11-10: 5000 [IU] via SUBCUTANEOUS
  Filled 2014-11-09 (×6): qty 1

## 2014-11-09 MED ORDER — TRIAMCINOLONE ACETONIDE 0.1 % EX CREA
1.0000 "application " | TOPICAL_CREAM | Freq: Two times a day (BID) | CUTANEOUS | Status: DC
  Administered 2014-11-10 – 2014-11-14 (×9): 1 via TOPICAL
  Filled 2014-11-09 (×2): qty 15

## 2014-11-09 MED ORDER — ONDANSETRON HCL 4 MG/2ML IJ SOLN: 4.0000 mg | Freq: Four times a day (QID) | INTRAMUSCULAR | Status: DC | PRN

## 2014-11-09 MED ORDER — DARBEPOETIN ALFA 60 MCG/0.3ML IJ SOSY: 60.0000 ug | PREFILLED_SYRINGE | INTRAMUSCULAR | Status: DC

## 2014-11-09 MED ORDER — NEPRO/CARBSTEADY PO LIQD: 237.0000 mL | ORAL | Status: DC | PRN

## 2014-11-09 MED ORDER — LIDOCAINE-PRILOCAINE 2.5-2.5 % EX CREA
1.0000 "application " | TOPICAL_CREAM | CUTANEOUS | Status: DC | PRN
  Filled 2014-11-09: qty 5

## 2014-11-09 MED ORDER — PENTAFLUOROPROP-TETRAFLUOROETH EX AERO: 1.0000 "application " | INHALATION_SPRAY | CUTANEOUS | Status: DC | PRN

## 2014-11-09 MED ORDER — RENA-VITE PO TABS
1.0000 | ORAL_TABLET | Freq: Every day | ORAL | Status: DC
  Administered 2014-11-09 – 2014-11-14 (×6): 1 via ORAL
  Filled 2014-11-09 (×6): qty 1

## 2014-11-09 MED ORDER — ACETAMINOPHEN 650 MG RE SUPP: 650.0000 mg | Freq: Four times a day (QID) | RECTAL | Status: DC | PRN

## 2014-11-09 NOTE — Progress Notes (Addendum)
Per dialysis RN, Katharine Look, patient already received dose of aranesp today.  Could not find documentation of administration in MAR.    Updated: Dialysis RN, Katharine Look, called to clarify.  Patient did not receive dose of aranesp today in HD.  Stated she will change dose to subcutaneous and will bring medication over to unit.

## 2014-11-09 NOTE — Procedures (Signed)
I was present at this session.  I have reviewed the session itself and made appropriate changes.  2 17ga needles in AVG without prob.  See how tol HD.    Raymonda Pell L 7/23/20164:27 PM

## 2014-11-09 NOTE — H&P (Signed)
[facility] History and Physical                                                                                    Robert Morgan, is a 81 y.o. male  MRN: [id number]   DOB - 1927/09/26  Admit Date - 11/09/2014  Outpatient Primary MD for the patient is Leota Jacobsen, MD  Referring MD: Coralyn Pear (accepted pt in transfer from [facility])  Consulting M.D: Deterding / Nephrology  With History of -  Past Medical History  Diagnosis Date  . Chronic kidney disease   . Hypertension   . Anemia   . Pacemaker   . Cancer     bladder -   . Hyperlipidemia   . Nephrolithiasis   . Atrial fibrillation       Past Surgical History  Procedure Laterality Date  . Tonsillectomy    . Bladder removal    . Permanent pacemaker insertion    . Revision of scar on face/head      due to wound   . Av fistula placement Right 10/01/2014    Procedure: INSERTION OF ARTERIOVENOUS (AV) GORE-TEX GRAFT RIGHT ARM;  Surgeon: Elam Dutch, MD;  Location: [place];  Service: Vascular;  Laterality: Right;  . Insert / replace / remove pacemaker    . Cholecystectomy    . Appendectomy    . Tonsillectomy      in for progressive right kidney disease now requiring dialysis    HPI This is a 81 year old male patient with known history of chronic kidney disease status post placement of right upper arm AV graft on June 14, history of hypertension, anemia of chronic disease, sick sinus syndrome requiring pacemaker, bladder cancer requiring urostomy. Patient presented to [facility] after experiencing extreme weakness in the past 24 hours. He felt so weak that he lay down on the floor for several hours and then opted to seek treatment. At the outside facility his creatinine was 8.1 with a bicarbonate of 14 a potassium of 4.1. Patient reports that he does not know exactly what his baseline creatinine has been it is never been as high as 8. Because of these findings and likelihood of need to initiate  hemodialysis during this admission patient was transferred to [place] at [facility] also spoke with the nephrologist prior to patient being transported.  In discussion with the patient he again reemphasizes that he did not fall down but lay down on the floor because he felt so bad. He has not had any shortness of breath or change in his chronic lower extremity edema. He is not had any nausea vomiting or abdominal pain or cramping. Denied any increase or decrease in urostomy output recently. In review of Dr. Oneida Alar postoperative follow-up note on 7/7 is documented that the graft is ready for use if needed. Patient is currently in process of being prepared to begin first dialysis treatment today   Review of Systems   In addition to the HPI above,  No Fever-chills or other constitutional symptoms No Headache, changes with Vision or hearing, tingling, numbness in any extremity; no focal weakness No problems swallowing food  or Liquids, indigestion/reflux No Chest pain, Cough or Shortness of Breath, palpitations, orthopnea or DOE No Abdominal pain, N/V; no melena or hematochezia, no dark tarry stools, Bowel movements are regular, No dysuria, hematuria or flank pain No new skin rashes, lesions, masses or bruises, No new joints pains-aches No recent weight gain or loss-he does report a 100 pound weight loss over the past year and a half No polyuria, polydypsia or polyphagia,  *A full 10 point Review of Systems was done, except as stated above, all other Review of Systems were negative.  Social History History  Substance Use Topics  . Smoking status: Former Smoker    Quit date: 04/19/1980  . Smokeless tobacco: Not on file  . Alcohol Use: 0.0 oz/week    0 Standard drinks or equivalent per week    Resides at: Private residence  Lives with: Spouse  Ambulatory status: Without assistive devices   Family History Family History  Problem Relation Age of Onset  . Stroke  Mother   . Hypertension Mother   . Varicose Veins Mother   . Heart disease Mother   . Heart attack Father      Prior to Admission medications   Medication Sig Start Date End Date Taking? Authorizing Provider  darbepoetin (ARANESP) 60 MCG/0.3ML SOLN injection Inject 60 mcg into the skin every 7 (seven) days.   Yes Historical Provider, MD  triamcinolone cream (KENALOG) 0.1 % Apply 1 application topically 2 (two) times daily.   Yes Historical Provider, MD  amLODipine (NORVASC) 10 MG tablet Take 10 mg by mouth daily.    Historical Provider, MD  aspirin EC 81 MG tablet Take 81 mg by mouth daily.    Historical Provider, MD  atorvastatin (LIPITOR) 10 MG tablet Take 10 mg by mouth daily.    Historical Provider, MD  calcitRIOL (ROCALTROL) 0.25 MCG capsule Take 0.25 mcg by mouth daily.    Historical Provider, MD  furosemide (LASIX) 40 MG tablet Take 40 mg by mouth daily.    Historical Provider, MD  metolazone (ZAROXOLYN) 5 MG tablet Take 5 mg by mouth daily.    Historical Provider, MD  metoprolol tartrate (LOPRESSOR) 25 MG tablet Take 25 mg by mouth 2 (two) times daily.    Historical Provider, MD  oxyCODONE-acetaminophen (ROXICET) 5-325 MG per tablet Take 1 tablet by mouth every 6 (six) hours as needed for severe pain. Patient not taking: Reported on 10/24/2014 10/01/14   Alvia Grove, PA-C  sodium bicarbonate 650 MG tablet Take 650 mg by mouth 3 (three) times daily.    Historical Provider, MD  temazepam (RESTORIL) 15 MG capsule Take 15 mg by mouth at bedtime as needed for sleep.    Historical Provider, MD    Not on File  Physical Exam  Vitals  Blood pressure 169/73, pulse 70, temperature 97.5 F (36.4 C), temperature source Oral, resp. rate 17, weight 159 lb (72.122 kg), SpO2 100 %.   General:  In no acute distress, appears younger than stated age-does appear somewhat pale  Psych:  Normal affect, Denies Suicidal or Homicidal ideations, Awake Alert, Oriented X 3. Speech and thought  patterns are clear and appropriate, no apparent short term memory deficits  Neuro:   No focal neurological deficits, CN II through XII intact, Strength 5/5 all 4 extremities, Sensation intact all 4 extremities.  ENT:  Ears and Eyes appear Normal, Conjunctivae clear, PER. Moist oral mucosa without erythema or exudates.  Neck:  Supple, No lymphadenopathy appreciated  Respiratory:  Symmetrical  chest wall movement, Good air movement bilaterally, CTAB. Room Air  Cardiac:  RRR, No Murmurs, trace right LE edema noted ` 1+, no JVD, No carotid bruits, peripheral pulses palpable at 2+  Abdomen:  Positive bowel sounds, Soft, Non tender, Non distended,  No masses appreciated, no obvious hepatosplenomegaly; urostomy bag  Skin:  No Cyanosis, Normal Skin Turgor, No Skin Rash or Bruise.  Extremities: Symmetrical without obvious trauma or injury,  no effusions.  Data Review  CBC No results for input(s): WBC, HGB, HCT, PLT, MCV, MCH, MCHC, RDW, LYMPHSABS, MONOABS, EOSABS, BASOSABS, BANDABS in the last 168 hours.  Invalid input(s): NEUTRABS, BANDSABD  Chemistries  No results for input(s): NA, K, CL, CO2, GLUCOSE, BUN, CREATININE, CALCIUM, MG, AST, ALT, ALKPHOS, BILITOT in the last 168 hours.  Invalid input(s): GFRCGP  CrCl cannot be calculated (Patient has no serum creatinine result on file.).  No results for input(s): TSH, T4TOTAL, T3FREE, THYROIDAB in the last 72 hours.  Invalid input(s): FREET3  Coagulation profile No results for input(s): INR, PROTIME in the last 168 hours.  No results for input(s): DDIMER in the last 72 hours.  Cardiac Enzymes No results for input(s): CKMB, TROPONINI, MYOGLOBIN in the last 168 hours.  Invalid input(s): CK  Invalid input(s): POCBNP  Urinalysis No results found for: COLORURINE, APPEARANCEUR, LABSPEC, [place], GLUCOSEU, HGBUR, BILIRUBINUR, KETONESUR, PROTEINUR, UROBILINOGEN, NITRITE, LEUKOCYTESUR  Imaging results:   No results  found.   Assessment & Plan  Principal Problem:   CKD stage 5, GFR less than 15 ml/min -Admit to telemetry -Appreciate nephrology assistance -Baseline creatinine unknown but patient reports definitely not as high as 8 -Plans are to begin dialysis this admission -Have discontinued Lasix Zaroxolyn and sodium bicarbonate with initiation of hemodialysis -Repeat renal function panel pending upon arrival to [facility]  Active Problems:   HTN  -Moderately controlled -Have decreased Lopressor from 25 mg twice a day to 12.5 mg and have decreased Norvasc from 5 mg to 2.5 mg in anticipation of impending dialysis; need to monitor hemodynamic response to hemodialysis before increasing dosages    Anemia in chronic kidney disease (CKD) -Based on hemoglobin unknown -CBC has been ordered    History of pacemaker/Atrial fib -not on Coumadin preadmit -CHADVASc = 3 -ck EKG    History of bladder cancer/Presence of urostomy -Urostomy bag emptied of 200 mL urine after arrival    DVT Prophylaxis: Subcutaneous heparin  Family Communication:   No family at bedside  Code Status:  Full code  Condition:  Stable  Discharge disposition: Anticipate discharge back to home within the next 48-72 hours pending evaluation of tolerance to hemodialysis; also needs to have outpatient dialysis set up prior to discharge  Time spent in minutes : 60      Wickham,ALLISON L. ANP on 11/09/2014 at 4:28 PM  Between 7am to 7pm - Pager - [phone]  After 7pm go to [email or web address] - password [email or web address]  And look for the night coverage person covering me after hours  [facility]  I have taken an interval history, reviewed the chart and examined the patient. I agree with the Advanced Practice Provider's note, impression and recommendations. I have made any necessary editorial changes. 81 year old male with history of C KD stage V, ESRD came with generalized weakness, was found to have increased BUN/creatinine.  Patient sent to [facility], hospital.  Patient currently receiving dialysis as per nephrology.

## 2014-11-09 NOTE — Consult Note (Signed)
Reason for Consult:HD Referring Physician: ED provider at [place] is an 81 y.o. male.  HPI: 81 y/o male w/ PMHx of CKD stage 5, HTN, anemia, HLD, bladder cancer s/p bladder removal with urostomy bag, and atrial fibrillation w/ pacemaker who presented to [facility] after being found down. Pt states he has been feeling weak in his knees and legs for 2 days. He denies falling but states he laid down this morning due to weakness. He has a hx of chronic kidney disease thought to be from HTN and diabetes. He had a AV graft place in his right upper arm on 6/14 in anticipation of HD in the near future. On presentation to [facility] his serum creatinine was 8.10 and BUN was 111. His HCO3 was 14 and he was started on a NaHCO3 gtt, and orders placed for transfer to [facility] for HD. Pt denies confusion, n/v, SOB, cough, and diarrhea. He has rt chest wall tenderness that he believes is due to being transferred by EMS this morning. Chest pain is worsened by deep inspiration and palpation. CXR at [facility] revealed rt basilar atelectasis vs pneumonia. He had a lactic acid of 1.3 and CK was 1452. Pt is on lasix 40mg  at home which he last took yesterday. Nephrology consulted for HD.     Past Medical History  Diagnosis Date  . Chronic kidney disease   . Hypertension   . Anemia   . Pacemaker   . Cancer     bladder -   . Hyperlipidemia   . Nephrolithiasis   . Atrial fibrillation     Past Surgical History  Procedure Laterality Date  . Tonsillectomy    . Bladder removal    . Permanent pacemaker insertion    . Revision of scar on face/head      due to wound   . Av fistula placement Right 10/01/2014    Procedure: INSERTION OF ARTERIOVENOUS (AV) GORE-TEX GRAFT RIGHT ARM;  Surgeon: Elam Dutch, MD;  Location: [facility] OR;  Service: Vascular;  Laterality: Right;    Family History  Problem Relation Age of Onset  . Stroke Mother   . Hypertension Mother   .  Varicose Veins Mother   . Heart disease Mother   . Heart attack Father     Social History:  reports that he quit smoking about 34 years ago. He does not have any smokeless tobacco history on file. He reports that he drinks alcohol. He reports that he does not use illicit drugs.  Allergies: No Known Allergies  Medications:  Scheduled: . multivitamin  1 tablet Oral QHS     No results found for this or any previous visit (from the past 48 hour(s)).  No results found.  Review of Systems  Constitutional: Negative for fever and weight loss.       Appetite has been good, last meal and fluid intake was last night, states he is thirsty  Respiratory: Negative for cough and shortness of breath.   Cardiovascular: Positive for chest pain (left chest wall tenderness) and leg swelling.  Gastrointestinal: Negative for nausea, vomiting, abdominal pain and diarrhea.  Genitourinary: Negative for hematuria.  Neurological: Positive for weakness (knees and legs b/l).       Denies confusion   Blood pressure 169/73, pulse 70, temperature 97.5 F (36.4 C), temperature source Oral, resp. rate 17, SpO2 100 %. Physical Exam  Constitutional: He appears well-developed and well-nourished. No distress.  HENT:  Dry  mucous membranes   Eyes: No scleral icterus.  Cardiovascular: Normal rate and regular rhythm.   Respiratory: Effort normal and breath sounds normal. No respiratory distress. He has no wheezes. He exhibits tenderness (rt chest wall tenderness below right breast).  GI: Soft. Bowel sounds are normal. He exhibits no distension. There is no tenderness.  Rt lateral urostomy bag intact, no signs of infxn, urine in bag light yellow color  Musculoskeletal: He exhibits edema (trace pedal edema on rt).  5/5 hand grip strength b/l, unable to test arm flexion due to rt chest wall pain.   Neurological: He is alert.  States he is at [place] and that it is 52 which he corrected to 2017  Skin: Skin is  warm and dry.  Gr 2/6 M,  2-3+ edema. Very frail appearance. AVG RUA B&T   Assessment/Plan: 1 CKD stage 5 now with ESRD requiring HD - labs while at [facility] revealed GFR of 6 with uremia (BUN 111), elevated creatinine of 8.10, and acidemia ( HCO3 14). He was started on HCO3 gtt while in the ED. He last saw Dr. Lorrene Reid with nephrology in May 2016 and creatinine at that time was 5.8. Likely pt has had a gradual decline in renal function leading to uremia causing weakness. His CK level was 1452 at outside hospital likely from prolong immobilization as he was apparently found down after 7 hours.  - stopped HCO3 gtt - HD today, checking Hep B antibody - UA - renal function panel pending - FENa - consider renal ultrasound - renal diet Suspect gradual onset uremia with weakness, acidemia.  Should have some reversibility but not sure self care and careing for wife is practical.  Will start HD and see if improves. 2. Right chest pain- likely 2/2 MSK due to costochondritis. He has chest wall point tenderness, pain is exacerbated by deep inspiration. CXR at outside hospital neg for fractures.   3. Thromboycytopenia-chornic, platelets 87 at [place]. Repeat CBC.  4. Normocytic anemia- hgb 10.7, MCV 94. On areanesp at home. Likely due to anemia of chronic kidney disease. Anemia panel ordered. CBC pending 5. Metabolic Bone Disease: further evaluate with PTH, phos, albumin, vit D, and calcium. On calcitiol and phoslo at home.  6. HTN- on lasix, metolzaone, and norvasc at home. Stop lasix and metolazone. Hold norvasc and reassess hemodynamics after HD today.  7. AFib not on AC. CHADs2VASc score 4, presumably not on AC due to hx of falls.   [name] 11/09/2014, 2:36 PM I have seen and examined this patient and agree with the plan of care seen, eval, examined, discussed with resident.  Patient counseled.   .  Lexy Meininger L 11/09/2014, 4:43 PM

## 2014-11-10 LAB — HEPATITIS B CORE ANTIBODY, TOTAL: HEP B C TOTAL AB: NEGATIVE

## 2014-11-10 LAB — HEPATITIS B SURFACE ANTIBODY,QUALITATIVE: HEP B S AB: NONREACTIVE

## 2014-11-10 LAB — HEPATITIS B SURFACE ANTIGEN: HEP B S AG: NEGATIVE

## 2014-11-10 MED ORDER — SODIUM CHLORIDE 0.9 % IV SOLN
125.0000 mg | INTRAVENOUS | Status: DC
  Administered 2014-11-11 – 2014-11-15 (×3): 125 mg via INTRAVENOUS
  Filled 2014-11-10 (×6): qty 10

## 2014-11-10 MED ORDER — CEFTRIAXONE SODIUM IN DEXTROSE 20 MG/ML IV SOLN
1.0000 g | INTRAVENOUS | Status: AC
  Administered 2014-11-10 – 2014-11-15 (×6): 1 g via INTRAVENOUS
  Filled 2014-11-10 (×6): qty 50

## 2014-11-10 NOTE — Progress Notes (Signed)
Subjective: Interval History:no complaints, state rib pain is resolving. Denies SOB, LH, and dizziness.   Objective: Vital signs in last 24 hours: Temp:  [97.5 F (36.4 C)-98.5 F (36.9 C)] 97.5 F (36.4 C) (07/24 0805) Pulse Rate:  [69-79] 76 (07/24 0805) Resp:  [13-17] 17 (07/24 0805) BP: (103-169)/(55-97) 147/59 mmHg (07/24 0805) SpO2:  [94 %-100 %] 97 % (07/24 0805) Weight:  [156 lb 15.5 oz (71.2 kg)-160 lb 4.4 oz (72.7 kg)] 156 lb 15.5 oz (71.2 kg) (07/23 1914) Weight change:   Intake/Output from previous day: 07/23 0701 - 07/24 0700 In: 240 [P.O.:240] Out: 1576 [Urine:400] Intake/Output this shift: Total I/O In: 240 [P.O.:240] Out: -   Physical Exam  Constitutional: He appears well-developed and well-nourished. No distress.  Eyes: Conjunctivae are normal.  Cardiovascular: Normal rate and regular rhythm.   No murmur heard. Pulmonary/Chest: Effort normal and breath sounds normal. No respiratory distress. He has no wheezes. He has no rales.  Abdominal: Soft. Bowel sounds are normal. He exhibits no distension. There is no tenderness.  Neurological: He is alert.  Skin: Skin is warm and dry.  GR 2/6 M, 1 + edema Point tenderness R lower ribs ant  Lab Results:  Recent Labs  11/09/14 1650  WBC 5.2  HGB 9.2*  HCT 28.6*  PLT 101*   BMET:  Recent Labs  11/09/14 1648  NA 141  K 3.5  CL 109  CO2 18*  GLUCOSE 112*  BUN 112*  CREATININE 7.67*  CALCIUM 7.8*   No results for input(s): PTH in the last 72 hours. Iron Studies:  Recent Labs  11/09/14 1650  IRON 28*  TIBC 200*  FERRITIN 135   CBG (last 3)  No results for input(s): GLUCAP in the last 72 hours.   Studies/Results: No results found.  Scheduled: . aspirin EC  81 mg Oral Daily  . atorvastatin  10 mg Oral q1800  . calcitRIOL  0.5 mcg Oral Daily  . darbepoetin (ARANESP) injection - DIALYSIS  100 mcg Intravenous Q Sat-HD  . [START ON 11/11/2014] ferric gluconate (FERRLECIT/NULECIT) IV  125 mg  Intravenous Q M,W,F-HD  . heparin  5,000 Units Subcutaneous 3 times per day  . metoprolol tartrate  12.5 mg Oral BID  . multivitamin  1 tablet Oral QHS  . sodium chloride  3 mL Intravenous Q12H  . triamcinolone cream  1 application Topical BID    Assessment/Plan: 1 CKD stage 5 now with ESRD requiring HD - labs while at [facility] revealed GFR of 6 with uremia (BUN 111), elevated creatinine of 8.10, and acidemia ( HCO3 14). He was started on HCO3 gtt while in the ED. He last saw Dr. Lorrene Reid with nephrology in May 2016 and creatinine at that time was 5.8. Likely pt has had a gradual decline in renal function leading to uremia causing weakness. His CK level was 1452 at outside hospital likely from prolong immobilization as he was apparently found down after 7 hours.  - tolerated HD yesterday well, denies light headedness or dizziness. Scheduled for HD again tomorrow.  - Hep B antibody negative - FENa 5.4 indicating post obstruction, does have urostomy bag, FENa could be falsely elevated due to ESRD - renal diet Will plan HD in am , less uremic.  Will do slow HD X3 then more efficient.  Start CLIP 2. Right chest pain- likely 2/2 MSK due to costochondritis. improving 3. Thromboycytopenia-chornic 4. Anemia of chronic kidney disease- cont aranesp  5. Metabolic Bone Disease: further evaluate  with PTH, phos, albumin, vit D, and calcium. On calcitiol and phoslo at home.  6. HTN- per primary 7. AFib not on [facility]- lopressor 12.5mg  BID    LOS: 1 day   Robert Morgan 11/10/2014,8:40 AM I have seen and examined this patient and agree with the plan of care seen, eval, counseled, examined,discussed with staff and resident. .  Robert Morgan 11/10/2014, 11:48 AM

## 2014-11-10 NOTE — Progress Notes (Signed)
Walked patient down the hallways with the use of walker.

## 2014-11-10 NOTE — Progress Notes (Signed)
Patient ID: Robert Morgan, male   DOB: 1927/05/27, 81 y.o.   MRN: [id number]  [facility] PROGRESS NOTE  DEANGLEO PASSAGE XBD:[id number] DOB: 1927-09-10 DOA: 11/09/2014 PCP: Leota Jacobsen, MD   Brief narrative:    81 year old male patient with known history of chronic kidney disease status post placement of right upper arm AV graft on June 14th, 2016, history of hypertension, anemia of chronic disease, sick sinus syndrome requiring pacemaker, bladder cancer requiring urostomy, transferred from [facility] for considering initiating HD at [facility]   Assessment/Plan:    Principal Problem:   CKD (chronic kidney disease) stage 5 now ESRD - requiring HD, 1st session 7/23 tolerated well, plan for second session in AM - appreciate nephrology team following  Active Problems:   Thrombocytopenia  - chronic, Plt ~ 100K - monitor, CBC in AM   Anemia of chronic disease, ESRD - aranesp per nephrology team    Right chest pain - likely 2/2 MSK due to costochondritis. Improving - provide analgesia as needed   Metabolic Bone Disease - On calcitiol and phoslo at home.    HTN - reasonable inpatient control    AFib not on AC, CHADS2 = 3-4  - lopressor 12.5mg  BID - no AC due to high risk fall    ? UTI - urine culture positive for E Coli - continue empiric Rocephin for now and follow upon urine culture   DVT prophylaxis - Heparin SQ  Code Status: Full.  Family Communication:  plan of care discussed with the patient Disposition Plan: Home when stable.   IV access:    Procedures and diagnostic studies:    No results found.  Medical Consultants:  Nephrology   Other Consultants:  PT  IAnti-Infectives:   Rocephin 7/24 -->  Faye Ramsay, MD  [facility] Pager [phone]  If 7PM-7AM, please contact night-coverage [email or web address] Password [email or web address] 11/10/2014, 3:08 PM   LOS: 1 day   HPI/Subjective: No events overnight.   Objective: Filed Vitals:   11/09/14 1914 11/09/14  2100 11/10/14 0500 11/10/14 0805  BP: 110/58 148/57 142/70 147/59  Pulse: 74 79 73 76  Temp: 98.4 F (36.9 C) 97.7 F (36.5 C) 98.5 F (36.9 C) 97.5 F (36.4 C)  TempSrc: Oral Oral Oral Oral  Resp: 13 16 16 17   Weight: 71.2 kg (156 lb 15.5 oz)     SpO2: 96% 94% 97% 97%    Intake/Output Summary (Last 24 hours) at 11/10/14 1508 Last data filed at 11/10/14 1259  Gross per 24 hour  Intake    600 ml  Output   1776 ml  Net  -1176 ml    Exam:   General:  Pt is alert, follows commands appropriately, not in acute distress  Cardiovascular: Regular rate and rhythm, SEM 2/6, no rubs, no gallops  Respiratory: Clear to auscultation bilaterally, no wheezing, no crackles, no rhonchi  Abdomen: Soft, non tender, non distended, bowel sounds present, no guarding  Extremities: pulses DP and PT palpable bilaterally  Neuro: Grossly nonfocal  Data Reviewed: Basic Metabolic Panel:  Recent Labs Lab 11/09/14 1648  NA 141  K 3.5  CL 109  CO2 18*  GLUCOSE 112*  BUN 112*  CREATININE 7.67*  CALCIUM 7.8*  PHOS 7.6*   Liver Function Tests:  Recent Labs Lab 11/09/14 1648  ALBUMIN 3.0*   CBC:  Recent Labs Lab 11/09/14 1650  WBC 5.2  NEUTROABS 3.9  HGB 9.2*  HCT 28.6*  MCV 92.0  PLT 101*  Recent Results (from the past 240 hour(s))  Urine culture     Status: None (Preliminary result)   Collection Time: 11/09/14  8:54 PM  Result Value Ref Range Status   Specimen Description URINE, CLEAN CATCH  Final   Special Requests NONE  Final   Culture >=100,000 COLONIES/mL ESCHERICHIA COLI  Final   Report Status PENDING  Incomplete     Scheduled Meds: . aspirin EC  81 mg Oral Daily  . atorvastatin  10 mg Oral q1800  . calcitRIOL  0.5 mcg Oral Daily  . darbepoetin (ARANESP) injection - DIALYSIS  100 mcg Intravenous Q Sat-HD  . [START ON 11/11/2014] ferric gluconate (FERRLECIT/NULECIT) IV  125 mg Intravenous Q M,W,F-HD  . heparin  5,000 Units Subcutaneous 3 times per day  .  metoprolol tartrate  12.5 mg Oral BID  . multivitamin  1 tablet Oral QHS  . sodium chloride  3 mL Intravenous Q12H  . triamcinolone cream  1 application Topical BID   Continuous Infusions:

## 2014-11-11 LAB — CBC
HCT: 27.9 % — ABNORMAL LOW (ref 39.0–52.0)
HEMOGLOBIN: 9 g/dL — AB (ref 13.0–17.0)
MCH: 30.2 pg (ref 26.0–34.0)
MCHC: 32.3 g/dL (ref 30.0–36.0)
MCV: 93.6 fL (ref 78.0–100.0)
Platelets: 88 10*3/uL — ABNORMAL LOW (ref 150–400)
RBC: 2.98 MIL/uL — AB (ref 4.22–5.81)
RDW: 14.1 % (ref 11.5–15.5)
WBC: 4 10*3/uL (ref 4.0–10.5)

## 2014-11-11 LAB — RENAL FUNCTION PANEL
ALBUMIN: 2.8 g/dL — AB (ref 3.5–5.0)
Anion gap: 10 (ref 5–15)
BUN: 77 mg/dL — ABNORMAL HIGH (ref 6–20)
CALCIUM: 7.2 mg/dL — AB (ref 8.9–10.3)
CO2: 23 mmol/L (ref 22–32)
Chloride: 103 mmol/L (ref 101–111)
Creatinine, Ser: 6.2 mg/dL — ABNORMAL HIGH (ref 0.61–1.24)
GFR calc Af Amer: 8 mL/min — ABNORMAL LOW (ref >60)
GFR, EST NON AFRICAN AMERICAN: 7 mL/min — AB (ref >60)
GLUCOSE: 109 mg/dL — AB (ref 65–99)
PHOSPHORUS: 6.2 mg/dL — AB (ref 2.5–4.6)
Potassium: 3 mmol/L — ABNORMAL LOW (ref 3.5–5.1)
Sodium: 136 mmol/L (ref 135–145)

## 2014-11-11 MED ORDER — ALTEPLASE 2 MG IJ SOLR
2.0000 mg | Freq: Once | INTRAMUSCULAR | Status: DC | PRN
  Filled 2014-11-11: qty 2

## 2014-11-11 MED ORDER — SODIUM CHLORIDE 0.9 % IV SOLN: 100.0000 mL | INTRAVENOUS | Status: DC | PRN

## 2014-11-11 MED ORDER — PENTAFLUOROPROP-TETRAFLUOROETH EX AERO: 1.0000 "application " | INHALATION_SPRAY | CUTANEOUS | Status: DC | PRN

## 2014-11-11 MED ORDER — HEPARIN SODIUM (PORCINE) 1000 UNIT/ML DIALYSIS: 100.0000 [IU]/kg | INTRAMUSCULAR | Status: DC | PRN

## 2014-11-11 MED ORDER — NEPRO/CARBSTEADY PO LIQD: 237.0000 mL | ORAL | Status: DC | PRN

## 2014-11-11 MED ORDER — HEPARIN SODIUM (PORCINE) 1000 UNIT/ML DIALYSIS: 1000.0000 [IU] | INTRAMUSCULAR | Status: DC | PRN

## 2014-11-11 MED ORDER — LIDOCAINE-PRILOCAINE 2.5-2.5 % EX CREA: 1.0000 "application " | TOPICAL_CREAM | CUTANEOUS | Status: DC | PRN

## 2014-11-11 MED ORDER — LIDOCAINE HCL (PF) 1 % IJ SOLN: 5.0000 mL | INTRAMUSCULAR | Status: DC | PRN

## 2014-11-11 NOTE — Progress Notes (Signed)
PT Cancellation Note  Patient Details Name: Robert Morgan MRN: [id number] DOB: 05-25-1927   Cancelled Treatment:    Reason Eval/Treat Not Completed: Patient at procedure or test/unavailable   Currently in HD; Will follow up later today as time allows;  Otherwise, will follow up for PT tomorrow;   Thank you,  Roney Marion, PT  Acute Rehabilitation Services Pager [phone] Office [phone]     Roney Marion [facility] 11/11/2014, 8:12 AM

## 2014-11-11 NOTE — Progress Notes (Signed)
OT Cancellation Note    11/11/14 0826  OT Visit Information  Last OT Received On 11/11/14  Reason Eval/Treat Not Completed Patient at procedure or test/ unavailable (Pt in HD)  [facility], OTR/L  [phone] 11/11/2014

## 2014-11-11 NOTE — Evaluation (Signed)
Physical Therapy Evaluation Patient Details Name: Robert Morgan MRN: [id number] DOB: 30-Aug-1927 Today's Date: 11/11/2014   History of Present Illness  81 year old male patient with known history of chronic kidney disease status post placement of right upper arm AV graft on June 14th, 2016, history of hypertension, anemia of chronic disease, sick sinus syndrome requiring pacemaker, bladder cancer requiring urostomy, transferred from [facility] for considering initiating HD at [place]  Pt admitted with above diagnosis. Pt currently with functional limitations due to the deficits listed below (see PT Problem List).  Pt will benefit from skilled PT to increase their independence and safety with mobility to allow discharge to the venue listed below.       Follow Up Recommendations SNF (Anticipate pt will refuse SNF; would recommend in that case, HHPT/OT/RN/SW; Would pt be able to participate in [facility] with his wife? Could they qualify for more assist in the home?)  Ultimately, it is time to consider a safe long-term living situation -- perhaps both pt and spouse at ALF?    Equipment Recommendations  Rolling walker with 5" wheels;3in1 (PT)    Recommendations for Other Services OT consult     Precautions / Restrictions Precautions Precautions: Fall Restrictions Weight Bearing Restrictions: No      Mobility  Bed Mobility               General bed mobility comments: up in chair  Transfers Overall transfer level: Needs assistance Equipment used: None Transfers: Sit to/from Stand Sit to Stand: Min assist         General transfer comment: Steady assist at initial stand; definite need for UEs for control  Ambulation/Gait Ambulation/Gait assistance: Min assist Ambulation Distance (Feet): 100 Feet (greater than) Assistive device: 1 person hand held assist;Straight cane;Rolling walker (2 wheeled) Gait Pattern/deviations: Decreased step length -  right;Decreased step length - left;Decreased stance time - right;Decreased stance time - left;Shuffle;Festinating;Trunk flexed Gait velocity: Extremely slow, and will slow to a stop if he is talking Gait velocity interpretation: Below normal speed for age/gender General Gait Details: Very uncoordinated steps, with little weight shift of body weight into stance R and L, resulting in small steps resembling festination; Unable to walk and perform other simple task at the same time (he stops walking when he talks); initially walked with hand held assist and cane, during which pt was quite unsteady, with one loss of balance requiring mod assist to prevent fall; Heavy dependence on UE supoort with RW use; no gross loss of balance, but tending to keep trunk flexed  Stairs            Wheelchair Mobility    Modified Rankin (Stroke Patients Only)       Balance Overall balance assessment: Needs assistance           Standing balance-Leahy Scale: Poor                               Pertinent Vitals/Pain Pain Assessment: Faces (minimal costochondral pain) Faces Pain Scale: No hurt Pain Location: Soreness L chest, MD's suspect costochondral pain Pain Descriptors / Indicators: Sore Pain Intervention(s): Monitored during session;Repositioned    Home Living Family/patient expects to be discharged to:: Private residence Living Arrangements: Spouse/significant other Available Help at Discharge: Other (Comment) (Personal Care Attendant for spouse) Type of Home: House Home Access:  (to be determined)     Home Layout: One level   Additional  Comments: Per discussion with Case Manager, pt's spouse is active with [facility] (in [place]); Also noteworthy -- pt's spouse had been in Assisted Living, until pt moved her back home so he could care for her.    Prior Function Level of Independence: Independent         Comments: Pt states he is independent; Still, noted recent falls in the  home     Hand Dominance        Extremity/Trunk Assessment   Upper Extremity Assessment: Defer to OT evaluation           Lower Extremity Assessment: Generalized weakness (noted decr coordination with steps; bil Hamstring tightness)      Cervical / Trunk Assessment: Kyphotic  Communication   Communication: No difficulties  Cognition Arousal/Alertness: Awake/alert Behavior During Therapy: WFL for tasks assessed/performed Overall Cognitive Status: No family/caregiver present to determine baseline cognitive functioning Area of Impairment: Safety/judgement         Safety/Judgement: Decreased awareness of safety;Decreased awareness of deficits     General Comments: Pt downplays his falls at home; states he can do in the home whatever post-acute inpatient rehab can do for him    General Comments General comments (skin integrity, edema, etc.): May be a difficult dc planning case    Exercises        Assessment/Plan    PT Assessment Patient needs continued PT services  PT Diagnosis Difficulty walking;Generalized weakness;Other (comment) (Gait and Balance Dysfunction)   PT Problem List Decreased strength;Decreased range of motion  PT Treatment Interventions DME instruction;Gait training;Stair training;Functional mobility training;Therapeutic activities;Therapeutic exercise;Balance training;Neuromuscular re-education;Patient/family education   PT Goals (Current goals can be found in the Care Plan section) Acute Rehab PT Goals Patient Stated Goal: REALLY wanting to go home PT Goal Formulation: With patient Time For Goal Achievement: 11/25/14 Potential to Achieve Goals: Good    Frequency Min 3X/week   Barriers to discharge Decreased caregiver support 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, however has his own gait and balance deficits to consider; SNF for post-acute rehab is indicated for his physical functioning, however, pt will likely decline    Co-evaluation                End of Session Equipment Utilized During Treatment: Gait belt Activity Tolerance: Patient tolerated treatment well Patient left: in chair;with call bell/phone within reach Nurse Communication: Mobility status         Time: 2563-8937 PT Time Calculation (min) (ACUTE ONLY): 32 min   Charges:   PT Evaluation $Initial PT Evaluation Tier I: 1 Procedure PT Treatments $Gait Training: 8-22 mins   PT G Codes:        Quin Hoop 11/11/2014, 3:37 PM  Roney Marion, Kim Pager [phone] Office [phone]

## 2014-11-11 NOTE — Progress Notes (Signed)
Subjective: Interval History:no complaints this morning, requesting to know when he can go home to care for his wife.   Objective: Vital signs in last 24 hours: Temp:  [97.1 F (36.2 C)-97.6 F (36.4 C)] 97.1 F (36.2 C) (07/25 0754) Pulse Rate:  [70-80] 70 (07/25 1030) Resp:  [17-29] 29 (07/25 1030) BP: (79-147)/(45-98) 79/45 mmHg (07/25 1015) SpO2:  [96 %-98 %] 96 % (07/25 0754) Weight:  [155 lb 3.3 oz (70.4 kg)-156 lb 4.9 oz (70.9 kg)] 156 lb 4.9 oz (70.9 kg) (07/25 0754) Weight change: -3 lb 12.7 oz (-1.722 kg)  Intake/Output from previous day: 07/24 0701 - 07/25 0700 In: 23 [P.O.:840; IV Piggyback:50] Out: 400 [Urine:400] Intake/Output this shift:    Physical Exam  Constitutional: He appears well-developed and well-nourished. No distress.  Eyes: Conjunctivae are normal.  Cardiovascular: Normal rate and regular rhythm.   No murmur heard. Pulmonary/Chest: Effort normal and breath sounds normal. No respiratory distress. He has no wheezes. He has no rales. He exhibits no tenderness (non tender to chest wall palpation).  Abdominal: Soft. Bowel sounds are normal. He exhibits no distension. There is no tenderness.  Neurological: He is alert.  Skin: Skin is warm and dry.    Lab Results:  Recent Labs  11/09/14 1650 11/11/14 0820  WBC 5.2 4.0  HGB 9.2* 9.0*  HCT 28.6* 27.9*  PLT 101* 88*   BMET:   Recent Labs  11/09/14 1648 11/11/14 0820  NA 141 136  K 3.5 3.0*  CL 109 103  CO2 18* 23  GLUCOSE 112* 109*  BUN 112* 77*  CREATININE 7.67* 6.20*  CALCIUM 7.8* 7.2*   No results for input(s): PTH in the last 72 hours. Iron Studies:   Recent Labs  11/09/14 1650  IRON 28*  TIBC 200*  FERRITIN 135   CBG (last 3)  No results for input(s): GLUCAP in the last 72 hours.   Studies/Results: No results found.  Scheduled: . aspirin EC  81 mg Oral Daily  . atorvastatin  10 mg Oral q1800  . calcitRIOL  0.5 mcg Oral Daily  . cefTRIAXone (ROCEPHIN)  IV  1 g  Intravenous Q24H  . darbepoetin (ARANESP) injection - DIALYSIS  100 mcg Intravenous Q Sat-HD  . ferric gluconate (FERRLECIT/NULECIT) IV  125 mg Intravenous Q M,W,F-HD  . heparin  5,000 Units Subcutaneous 3 times per day  . metoprolol tartrate  12.5 mg Oral BID  . multivitamin  1 tablet Oral QHS  . sodium chloride  3 mL Intravenous Q12H  . triamcinolone cream  1 application Topical BID    Assessment/Plan: 1. ESRD newly started on HD this admission- continue with HD 2. Right chest pain- likely 2/2 MSK due to costochondritis. improving 3. Thromboycytopenia-chornic 4. Anemia of chronic kidney disease- cont aranesp  5. Metabolic Bone Disease: further evaluate with PTH, phos, albumin, vit D, and calcium. On calcitiol and phoslo at home.  6. HTN- per primary 7. AFib not on [facility]- lopressor 12.5mg  BID    LOS: 2 days   [name] 11/11/2014,10:42 AM

## 2014-11-11 NOTE — Procedures (Signed)
I have seen and examined this patient and agree with the plan of care. No complaints today seen on dialysis [facility] W 11/11/2014, 10:14 AM

## 2014-11-11 NOTE — Progress Notes (Signed)
Patient ID: Robert Morgan, male   DOB: 05-Jan-1928, 81 y.o.   MRN: [id number]  [facility] PROGRESS NOTE  Robert Morgan:[id number] DOB: 1927/05/25 DOA: 11/09/2014 PCP: Leota Jacobsen, MD   Brief narrative:    81 year old male patient with known history of chronic kidney disease status post placement of right upper arm AV graft on June 14th, 2016, history of hypertension, anemia of chronic disease, sick sinus syndrome requiring pacemaker, bladder cancer requiring urostomy, transferred from [facility] for considering initiating HD at [facility].   Assessment/Plan:    Principal Problem:   CKD (chronic kidney disease) stage 5 now ESRD - requiring HD, 1st session 7/24 tolerated well, plan for second session today 7/25 - appreciate nephrology team following  - pt can go home once cleared by nephrology team  Active Problems:   Thrombocytopenia  - chronic, Plt ~ 100K --> 80 K - hold Heparin SQ, place on SCD's  - monitor, CBC in AM   Anemia of chronic disease, ESRD - aranesp per nephrology team    Hypokalemia - address with HD   Right chest pain - likely 2/2 MSK due to costochondritis. Improving - provide analgesia as needed   Metabolic Bone Disease - On calcitiol and phoslo at home.    HTN - reasonable inpatient control    AFib not on AC, CHADS2 = 3-4  - lopressor 12.5mg  BID - no AC due to high risk fall    ? UTI - urine culture positive for E Coli - continue empiric Rocephin day #2 for now and follow upon urine culture   DVT prophylaxis - change Heparin Sq to DVT due to thrombocytopenia  Code Status: Full.  Family Communication:  plan of care discussed with the patient Disposition Plan: Home when stable.   IV access:    Procedures and diagnostic studies:    No results found.  Medical Consultants:  Nephrology   Other Consultants:  PT  IAnti-Infectives:   Rocephin 7/24 -->  Faye Ramsay, MD  [facility] Pager [phone]  If 7PM-7AM, please contact  night-coverage [email or web address] Password [email or web address] 11/11/2014, 1:51 PM   LOS: 2 days   HPI/Subjective: No events overnight. Wants to go home   Objective: Filed Vitals:   11/11/14 1015 11/11/14 1030 11/11/14 1048 11/11/14 1058  BP: 79/45 90/47 108/53 130/71  Pulse: 71 70 70 73  Temp:    98.2 F (36.8 C)  TempSrc:    Oral  Resp:  29  18  Height:      Weight:      SpO2:    97%    Intake/Output Summary (Last 24 hours) at 11/11/14 1351 Last data filed at 11/11/14 1222  Gross per 24 hour  Intake    650 ml  Output   1390 ml  Net   -740 ml    Exam:   General:  Pt is alert, follows commands appropriately, not in acute distress  Cardiovascular: Regular rate and rhythm, SEM 2/6, no rubs, no gallops  Respiratory: Clear to auscultation bilaterally, no wheezing, no crackles, no rhonchi  Abdomen: Soft, non tender, non distended, bowel sounds present, no guarding  Extremities: pulses DP and PT palpable bilaterally  Neuro: Grossly nonfocal  Data Reviewed: Basic Metabolic Panel:  Recent Labs Lab 11/09/14 1648 11/11/14 0820  NA 141 136  K 3.5 3.0*  CL 109 103  CO2 18* 23  GLUCOSE 112* 109*  BUN 112* 77*  CREATININE 7.67* 6.20*  CALCIUM 7.8* 7.2*  PHOS 7.6* 6.2*  Liver Function Tests:  Recent Labs Lab 11/09/14 1648 11/11/14 0820  ALBUMIN 3.0* 2.8*   CBC:  Recent Labs Lab 11/09/14 1650 11/11/14 0820  WBC 5.2 4.0  NEUTROABS 3.9  --   HGB 9.2* 9.0*  HCT 28.6* 27.9*  MCV 92.0 93.6  PLT 101* 88*     Recent Results (from the past 240 hour(s))  Urine culture     Status: None (Preliminary result)   Collection Time: 11/09/14  8:54 PM  Result Value Ref Range Status   Specimen Description URINE, CLEAN CATCH  Final   Special Requests NONE  Final   Culture   Final    >=100,000 COLONIES/mL ESCHERICHIA COLI CULTURE REINCUBATED FOR BETTER GROWTH    Report Status PENDING  Incomplete     Scheduled Meds: . aspirin EC  81 mg Oral Daily  . atorvastatin  10 mg Oral  q1800  . calcitRIOL  0.5 mcg Oral Daily  . cefTRIAXone (ROCEPHIN)  IV  1 g Intravenous Q24H  . darbepoetin (ARANESP) injection - DIALYSIS  100 mcg Intravenous Q Sat-HD  . ferric gluconate (FERRLECIT/NULECIT) IV  125 mg Intravenous Q M,W,F-HD  . metoprolol tartrate  12.5 mg Oral BID  . multivitamin  1 tablet Oral QHS  . sodium chloride  3 mL Intravenous Q12H  . triamcinolone cream  1 application Topical BID   Continuous Infusions:

## 2014-11-12 LAB — RENAL FUNCTION PANEL
Albumin: 2.6 g/dL — ABNORMAL LOW (ref 3.5–5.0)
Anion gap: 9 (ref 5–15)
BUN: 47 mg/dL — AB (ref 6–20)
CALCIUM: 7.3 mg/dL — AB (ref 8.9–10.3)
CO2: 24 mmol/L (ref 22–32)
CREATININE: 4.42 mg/dL — AB (ref 0.61–1.24)
Chloride: 102 mmol/L (ref 101–111)
GFR, EST AFRICAN AMERICAN: 13 mL/min — AB (ref >60)
GFR, EST NON AFRICAN AMERICAN: 11 mL/min — AB (ref >60)
Glucose, Bld: 98 mg/dL (ref 65–99)
Phosphorus: 5.1 mg/dL — ABNORMAL HIGH (ref 2.5–4.6)
Potassium: 3.2 mmol/L — ABNORMAL LOW (ref 3.5–5.1)
Sodium: 135 mmol/L (ref 135–145)

## 2014-11-12 LAB — CBC
HEMATOCRIT: 28.5 % — AB (ref 39.0–52.0)
Hemoglobin: 9.1 g/dL — ABNORMAL LOW (ref 13.0–17.0)
MCH: 30.3 pg (ref 26.0–34.0)
MCHC: 31.9 g/dL (ref 30.0–36.0)
MCV: 95 fL (ref 78.0–100.0)
PLATELETS: 81 10*3/uL — AB (ref 150–400)
RBC: 3 MIL/uL — AB (ref 4.22–5.81)
RDW: 14.1 % (ref 11.5–15.5)
WBC: 4 10*3/uL (ref 4.0–10.5)

## 2014-11-12 LAB — URINE CULTURE

## 2014-11-12 MED ORDER — POTASSIUM CHLORIDE CRYS ER 20 MEQ PO TBCR
40.0000 meq | EXTENDED_RELEASE_TABLET | Freq: Once | ORAL | Status: AC
  Administered 2014-11-12: 40 meq via ORAL
  Filled 2014-11-12 (×3): qty 2

## 2014-11-12 NOTE — Care Management Important Message (Signed)
Important Message  Patient Details  Name: Robert Morgan MRN: [id number] Date of Birth: [birth year]   Medicare Important Message Given:  Yes-second notification given    Delorse Lek 11/12/2014, 2:11 PM

## 2014-11-12 NOTE — Progress Notes (Signed)
Utilization Review Completed.Robert Morgan T7/26/2016  

## 2014-11-12 NOTE — Progress Notes (Signed)
Ambulated pt down the hall with his rolling walker.

## 2014-11-12 NOTE — Evaluation (Signed)
Occupational Therapy Evaluation Patient Details Name: Robert Morgan MRN: [id number] DOB: 01/13/1928 Today's Date: 11/12/2014    History of Present Illness 81 year old male patient with known history of chronic kidney disease status post placement of right upper arm AV graft on June 14th, 2016, history of hypertension, anemia of chronic disease, sick sinus syndrome requiring pacemaker, bladder cancer requiring urostomy, transferred from [facility] for considering initiating HD at [place]   Pt admitted with above. Pt independent with ADLs, PTA. Feel pt will benefit from acute OT to increase independence prior to d/c. Recommending SNF for rehab, however pt doesn't seem agreeable to this. If pt continues to refuse SNF, then recommend HHOT upon d/c. Discussed with case Freight forwarder.    Follow Up Recommendations  SNF;Supervision/Assistance - 24 hour    Equipment Recommendations  3 in 1 bedside comode  Recommendations for Other Services       Precautions / Restrictions Precautions Precautions: Fall Restrictions Weight Bearing Restrictions: No      Mobility Bed Mobility                  Transfers Overall transfer level: Needs assistance   Transfers: Sit to/from Stand Sit to Stand: Min guard         General transfer comment: cues for technique.    Balance  Used walker for ambulation-Min guard.                                          ADL Overall ADL's : Needs assistance/impaired                     Lower Body Dressing: Sit to/from stand;Minimal assistance   Toilet Transfer: Min guard;Ambulation;RW (sit <> stand to/from bed)           Functional mobility during ADLs: Min guard;Rolling walker General ADL Comments: Explained there is AE to assist with donning sock and explained it is sold in [place]. Discussed d/c recommendation with pt and talked about concern with pt being able to manage at home.      Vision     Perception     Praxis      Pertinent Vitals/Pain Pain Assessment: 0-10 Pain Score: 4  Pain Location: abdomen when he strains Pain Intervention(s): Monitored during session     Hand Dominance     Extremity/Trunk Assessment Upper Extremity Assessment Upper Extremity Assessment: Overall WFL for tasks assessed   Lower Extremity Assessment Lower Extremity Assessment: Defer to PT evaluation       Communication Communication Communication: No difficulties   Cognition Arousal/Alertness: Awake/alert Behavior During Therapy: WFL for tasks assessed/performed Overall Cognitive Status: No family/caregiver present to determine baseline cognitive functioning Area of Impairment: Safety/judgement         Safety/Judgement: Decreased awareness of safety;Decreased awareness of deficits         General Comments       Exercises       Shoulder Instructions      Home Living Family/patient expects to be discharged to:: Private residence Living Arrangements: Spouse/significant other Available Help at Discharge: Other (Comment) (personal care attendant for spouse) Type of Home: House       Home Layout: One level     Bathroom Shower/Tub: Occupational psychologist: Standard     Home Equipment:  (reports there is a walker and wheelchair  at home)   Additional Comments: Per PT evaluation-Per discussion with Case Manager, pt's spouse is active with [facility] (in [place]); Also noteworthy -- pt's spouse had been in Assisted Living, until pt moved her back home so he could care for her.      Prior Functioning/Environment Level of Independence: Independent        Comments: Per PT eval, pt states he is independent; Still, noted recent falls in the home    OT Diagnosis: Generalized weakness;Acute pain   OT Problem List: Pain;Decreased knowledge of precautions;Decreased knowledge of use of DME or AE;Decreased safety awareness;Decreased  cognition;Decreased strength;Decreased range of motion   OT Treatment/Interventions: Self-care/ADL training;DME and/or AE instruction;Therapeutic activities;Patient/family education;Balance training;Cognitive remediation/compensation;Therapeutic exercise    OT Goals(Current goals can be found in the care plan section) Acute Rehab OT Goals Patient Stated Goal: wants to go home OT Goal Formulation: With patient Time For Goal Achievement: 11/19/14 Potential to Achieve Goals: Good ADL Goals Pt Will Perform Lower Body Dressing: with set-up;sit to/from stand;with supervision;with adaptive equipment Pt Will Transfer to Toilet: with supervision;ambulating Pt Will Perform Toileting - Clothing Manipulation and hygiene: sit to/from stand;with modified independence Additional ADL Goal #1: Pt will perform bed mobility at supervision level as precursor for ADLs.  OT Frequency: Min 2X/week   Barriers to D/C: Decreased caregiver support          Co-evaluation              End of Session Equipment Utilized During Treatment: Gait belt;Rolling walker  Activity Tolerance: Patient tolerated treatment well Patient left: in bed;with call bell/phone within reach;with bed alarm set   Time: 9326-7124 OT Time Calculation (min): 17 min Charges:  OT General Charges $OT Visit: 1 Procedure OT Evaluation $Initial OT Evaluation Tier I: 1 Procedure G-CodesBenito Mccreedy OTR/L [id number] 11/12/2014, 3:04 PM

## 2014-11-12 NOTE — Progress Notes (Signed)
Subjective: Interval History:no complaints this morning, requesting to know when he can go home to care for his wife.   Objective: Vital signs in last 24 hours: Temp:  [97.6 F (36.4 C)-98.5 F (36.9 C)] 98.5 F (36.9 C) (07/26 0901) Pulse Rate:  [70-76] 76 (07/26 0901) Resp:  [17-18] 18 (07/26 0901) BP: (105-130)/(44-71) 106/55 mmHg (07/26 0901) SpO2:  [97 %-100 %] 100 % (07/26 0901) Weight:  [153 lb (69.4 kg)] 153 lb (69.4 kg) (07/25 2109) Weight change: 1 lb 1.6 oz (0.5 kg)  Intake/Output from previous day: 07/25 0701 - 07/26 0700 In: 360 [P.O.:360] Out: 1490 [Urine:400] Intake/Output this shift: Total I/O In: 300 [P.O.:300] Out: 0   Physical Exam  Constitutional: He appears well-developed and well-nourished. No distress.  HENT:  Head: Normocephalic and atraumatic.  Pulmonary/Chest: Effort normal. Tenderness: non tender to chest wall palpation.  Abdominal: He exhibits no distension.  Skin: Skin is warm and dry.    Lab Results:  Recent Labs  11/11/14 0820 11/12/14 0528  WBC 4.0 4.0  HGB 9.0* 9.1*  HCT 27.9* 28.5*  PLT 88* 81*   BMET:   Recent Labs  11/11/14 0820 11/12/14 0528  NA 136 135  K 3.0* 3.2*  CL 103 102  CO2 23 24  GLUCOSE 109* 98  BUN 77* 47*  CREATININE 6.20* 4.42*  CALCIUM 7.2* 7.3*   No results for input(s): PTH in the last 72 hours. Iron Studies:   Recent Labs  11/09/14 1650  IRON 28*  TIBC 200*  FERRITIN 135   CBG (last 3)  No results for input(s): GLUCAP in the last 72 hours.   Studies/Results: No results found.  Scheduled: . aspirin EC  81 mg Oral Daily  . atorvastatin  10 mg Oral q1800  . calcitRIOL  0.5 mcg Oral Daily  . cefTRIAXone (ROCEPHIN)  IV  1 g Intravenous Q24H  . darbepoetin (ARANESP) injection - DIALYSIS  100 mcg Intravenous Q Sat-HD  . ferric gluconate (FERRLECIT/NULECIT) IV  125 mg Intravenous Q M,W,F-HD  . metoprolol tartrate  12.5 mg Oral BID  . multivitamin  1 tablet Oral QHS  . potassium  chloride  40 mEq Oral Once  . sodium chloride  3 mL Intravenous Q12H  . triamcinolone cream  1 application Topical BID    Assessment/Plan: 1. ESRD newly started on HD this admission- scheduled to have low volume HD tomorrow which will be his 3rd session.  - CLIP in process - hypokalemic this am, K 3.2, repleted w/ KDur 12meq po once 2. Right chest pain- likely 2/2 MSK due to costochondritis. 3. Thromboycytopenia-chornic 4. Anemia of chronic kidney disease- cont aranesp  5. Metabolic Bone Disease: further evaluate with PTH, phos, albumin, vit D, and calcium. On calcitiol and phoslo at home.  6. HTN- per primary 7. AFib not on [facility]- lopressor 12.5mg  BID 8. Dispo- PT following, recommend SNF. Apparently pt takes care of his 59 year old wife w/dementia at home, questionable if he is fully able to. Agree with PT for ALF.     LOS: 3 days   [name] 11/12/2014,10:52 AM

## 2014-11-12 NOTE — Clinical Social Work Note (Signed)
CSW and nurse case manager, [employer] talked with patient about discharge planning, outpatient dialysis and current care plan for his wife at home. Patient in agreement with ST rehab - full assessment to follow.   Lyndsey Demos Givens, MSW, LCSW Licensed Clinical Social Worker [place] [phone]

## 2014-11-12 NOTE — Clinical Documentation Improvement (Signed)
A cause and effect relationship may not be assumed and must be documented by a provider. Please clarify the relationship, if any, between  urostomy and UTI. Please document in progress note and discharge summary.   Are the conditions: ? Due to or associated with each other ? Other (please specify) ___________________ ? Unrelated to each other ? Unable to determine ? Unknown    Thank you,  Carrolyn Meiers, RN [email or web address] [phone]

## 2014-11-12 NOTE — Progress Notes (Signed)
Patient ID: JADA KUHNERT, male   DOB: 1927/11/22, 81 y.o.   MRN: [id number]  [facility] PROGRESS NOTE  SUTTON HIRSCH [id number] DOB: Jan 10, 1928 DOA: 11/09/2014 PCP: Leota Jacobsen, MD   Brief narrative:    81 year old male patient with known history of chronic kidney disease status post placement of right upper arm AV graft on June 14th, 2016, history of hypertension, anemia of chronic disease, sick sinus syndrome requiring pacemaker, bladder cancer requiring urostomy, transferred from [facility] for considering initiating HD at [facility].   Assessment/Plan:    Principal Problem:   CKD (chronic kidney disease) stage 5 now ESRD - requiring HD, 1st session 7/24 tolerated well, plan for second session today 7/25 - plan for HD in AM, CLIP in progress  - appreciate nephrology team following  Active Problems:   Thrombocytopenia  - chronic, Plt ~ 100K --> 88 --> 81 K - hold Heparin SQ, placed on SCD's  - monitor, CBC in AM   Anemia of chronic disease, ESRD - aranesp per nephrology team    Hypokalemia - address with HD - repeat renal panel in AM   Right chest pain - likely 2/2 MSK due to costochondritis. Improving - provide analgesia as needed   Metabolic Bone Disease - On calcitiol and phoslo at home.    HTN - reasonable inpatient control    AFib not on AC, CHADS2 = 3-4  - lopressor 12.5mg  BID - no AC due to high risk fall    ? UTI - urine culture positive for E Coli - continue empiric Rocephin day #3 for now and follow upon urine culture final report   DVT prophylaxis - changed Heparin SQ to SCD's due to thrombocytopenia   Code Status: Full.  Family Communication:  plan of care discussed with the patient Disposition Plan: SNF vs ALF, disposition in progress   IV access:    Procedures and diagnostic studies:    No results found.  Medical Consultants:  Nephrology   Other Consultants:  PT  IAnti-Infectives:   Rocephin 7/24 -->  Faye Ramsay, MD  [facility] Pager [phone]  If 7PM-7AM, please contact night-coverage [email or web address] Password [facility] 11/12/2014, 2:36 PM   LOS: 3 days   HPI/Subjective: No events overnight. Wants to go home   Objective: Filed Vitals:   11/11/14 2017 11/11/14 2109 11/12/14 0512 11/12/14 0901  BP: 130/56  105/44 106/55  Pulse: 73  70 76  Temp: 98.5 F (36.9 C)  97.6 F (36.4 C) 98.5 F (36.9 C)  TempSrc: Oral  Oral Oral  Resp: 18  18 18   Height:      Weight:  69.4 kg (153 lb)    SpO2: 98%  97% 100%    Intake/Output Summary (Last 24 hours) at 11/12/14 1436 Last data filed at 11/12/14 0901  Gross per 24 hour  Intake    540 ml  Output    100 ml  Net    440 ml    Exam:   General:  Pt is alert, follows commands appropriately, not in acute distress  Cardiovascular: Regular rate and rhythm, SEM 2/6, no rubs, no gallops  Respiratory: Clear to auscultation bilaterally, no wheezing, no crackles, no rhonchi  Abdomen: Soft, non tender, non distended, bowel sounds present, no guarding  Extremities: pulses DP and PT palpable bilaterally  Neuro: Grossly nonfocal  Data Reviewed: Basic Metabolic Panel:  Recent Labs Lab 11/09/14 1648 11/11/14 0820 11/12/14 0528  NA 141 136 135  K 3.5 3.0* 3.2*  CL 109 103 102  CO2 18* 23 24  GLUCOSE 112* 109* 98  BUN 112* 77* 47*  CREATININE 7.67* 6.20* 4.42*  CALCIUM 7.8* 7.2* 7.3*  PHOS 7.6* 6.2* 5.1*   Liver Function Tests:  Recent Labs Lab 11/09/14 1648 11/11/14 0820 11/12/14 0528  ALBUMIN 3.0* 2.8* 2.6*   CBC:  Recent Labs Lab 11/09/14 1650 11/11/14 0820 11/12/14 0528  WBC 5.2 4.0 4.0  NEUTROABS 3.9  --   --   HGB 9.2* 9.0* 9.1*  HCT 28.6* 27.9* 28.5*  MCV 92.0 93.6 95.0  PLT 101* 88* 81*     Recent Results (from the past 240 hour(s))  Urine culture     Status: None   Collection Time: 11/09/14  8:54 PM  Result Value Ref Range Status   Specimen Description URINE, CLEAN CATCH  Final   Special Requests NONE  Final    Report Status 11/12/2014 FINAL  Final     Scheduled Meds: . aspirin EC  81 mg Oral Daily  . atorvastatin  10 mg Oral q1800  . calcitRIOL  0.5 mcg Oral Daily  . cefTRIAXone (ROCEPHIN)  IV  1 g Intravenous Q24H  . darbepoetin (ARANESP) injection - DIALYSIS  100 mcg Intravenous Q Sat-HD  . ferric gluconate (FERRLECIT/NULECIT) IV  125 mg Intravenous Q M,W,F-HD  . metoprolol tartrate  12.5 mg Oral BID  . multivitamin  1 tablet Oral QHS  . sodium chloride  3 mL Intravenous Q12H  . triamcinolone cream  1 application Topical BID   Continuous Infusions:

## 2014-11-12 NOTE — Care Management Note (Signed)
Case Management Note  Patient Details  Name: Robert Morgan MRN: [id number] Date of Birth: [birth year]  Subjective/Objective:         CM following for progression and d/c planning.           Action/Plan: 11/12/2014 Met with pt re d/c plans, this pt has consistently stated that he plan to d/c to home and care for his wife who has dementia. This CM spoke with pt re plans and discussed pt actual needs. At issue is the fact that this pt is unable to help himself and would need assistance in the home. As this is not available to him and in addition he is starting outpatient HD, it is imperative that he receive ongoing therapy to increase his strength. In order for the pt to become independent skill rehab on a daily basis is needed. The pt has agreed and asked this CM to call his daughter Octavio Graves in [place], [place] @ [phone] and ask her to come assist him in getting this clothing and personal items to the rehab facility.  CSW, Lorriane Shire explained to the patient the process of searching for short term rehab.   Expected Discharge Date:  11/13/14               Expected Discharge Plan:  Skilled Nursing Facility  In-House Referral:  Clinical Social Work  Discharge planning Services  CM Consult  Post Acute Care Choice:    Choice offered to:     DME Arranged:    DME Agency:     HH Arranged:    [place] Agency:     Status of Service:  In process, will continue to follow  Medicare Important Message Given:  Yes-second notification given Date Medicare IM Given:    Medicare IM give by:    Date Additional Medicare IM Given:    Additional Medicare Important Message give by:     If discussed at [place] of Stay Meetings, dates discussed:    Additional Comments:  Adron Bene, RN 11/12/2014, 3:56 PM

## 2014-11-13 DIAGNOSIS — I10 Essential (primary) hypertension: Secondary | ICD-10-CM

## 2014-11-13 DIAGNOSIS — N185 Chronic kidney disease, stage 5: Secondary | ICD-10-CM

## 2014-11-13 DIAGNOSIS — N189 Chronic kidney disease, unspecified: Secondary | ICD-10-CM

## 2014-11-13 DIAGNOSIS — D631 Anemia in chronic kidney disease: Secondary | ICD-10-CM

## 2014-11-13 LAB — RENAL FUNCTION PANEL
Albumin: 2.8 g/dL — ABNORMAL LOW (ref 3.5–5.0)
Anion gap: 11 (ref 5–15)
BUN: 65 mg/dL — ABNORMAL HIGH (ref 6–20)
CALCIUM: 7.8 mg/dL — AB (ref 8.9–10.3)
CO2: 24 mmol/L (ref 22–32)
Chloride: 105 mmol/L (ref 101–111)
Creatinine, Ser: 5.58 mg/dL — ABNORMAL HIGH (ref 0.61–1.24)
GFR calc Af Amer: 10 mL/min — ABNORMAL LOW (ref >60)
GFR calc non Af Amer: 8 mL/min — ABNORMAL LOW (ref >60)
Glucose, Bld: 102 mg/dL — ABNORMAL HIGH (ref 65–99)
Phosphorus: 5.9 mg/dL — ABNORMAL HIGH (ref 2.5–4.6)
Potassium: 3.8 mmol/L (ref 3.5–5.1)
Sodium: 140 mmol/L (ref 135–145)

## 2014-11-13 LAB — CBC
HEMATOCRIT: 31.1 % — AB (ref 39.0–52.0)
Hemoglobin: 9.8 g/dL — ABNORMAL LOW (ref 13.0–17.0)
MCH: 30.2 pg (ref 26.0–34.0)
MCHC: 31.5 g/dL (ref 30.0–36.0)
MCV: 95.7 fL (ref 78.0–100.0)
Platelets: 99 10*3/uL — ABNORMAL LOW (ref 150–400)
RBC: 3.25 MIL/uL — ABNORMAL LOW (ref 4.22–5.81)
RDW: 14.1 % (ref 11.5–15.5)
WBC: 4.3 10*3/uL (ref 4.0–10.5)

## 2014-11-13 MED ORDER — OXYCODONE-ACETAMINOPHEN 5-325 MG PO TABS: 1.0000 | ORAL_TABLET | Freq: Four times a day (QID) | ORAL | Status: DC | PRN

## 2014-11-13 MED ORDER — LIDOCAINE HCL (PF) 1 % IJ SOLN: 5.0000 mL | INTRAMUSCULAR | Status: DC | PRN

## 2014-11-13 MED ORDER — LIDOCAINE-PRILOCAINE 2.5-2.5 % EX CREA: 1.0000 "application " | TOPICAL_CREAM | CUTANEOUS | Status: DC | PRN

## 2014-11-13 MED ORDER — PENTAFLUOROPROP-TETRAFLUOROETH EX AERO: 1.0000 "application " | INHALATION_SPRAY | CUTANEOUS | Status: DC | PRN

## 2014-11-13 MED ORDER — SODIUM CHLORIDE 0.9 % IV SOLN: 100.0000 mL | INTRAVENOUS | Status: DC | PRN

## 2014-11-13 MED ORDER — HEPARIN SODIUM (PORCINE) 1000 UNIT/ML DIALYSIS: 1000.0000 [IU] | INTRAMUSCULAR | Status: DC | PRN

## 2014-11-13 MED ORDER — NEPRO/CARBSTEADY PO LIQD: 237.0000 mL | ORAL | Status: DC | PRN

## 2014-11-13 MED ORDER — ALTEPLASE 2 MG IJ SOLR: 2.0000 mg | Freq: Once | INTRAMUSCULAR | Status: DC | PRN

## 2014-11-13 MED ORDER — OXYCODONE-ACETAMINOPHEN 5-325 MG PO TABS
ORAL_TABLET | ORAL | Status: AC
  Filled 2014-11-13: qty 1

## 2014-11-13 MED ORDER — CALCITRIOL 0.5 MCG PO CAPS
ORAL_CAPSULE | ORAL | Status: AC
  Administered 2014-11-13: 0.5 ug
  Filled 2014-11-13: qty 1

## 2014-11-13 MED ORDER — RENA-VITE PO TABS: 1.0000 | ORAL_TABLET | Freq: Every day | ORAL | Status: AC

## 2014-11-13 NOTE — Progress Notes (Signed)
PT Cancellation Note  Patient Details Name: Robert Morgan MRN: [id number] DOB: Nov 04, 1927   Cancelled Treatment:    Reason Eval/Treat Not Completed: Patient at procedure or test/unavailable.  Pt in HD.  PT will attempt to check back later as time allows. Thanks,    Barbarann Ehlers. Gretel Cantu, PT, DPT [phone]   11/13/2014, 11:44 AM

## 2014-11-13 NOTE — Clinical Social Work Placement (Signed)
   CLINICAL SOCIAL WORK PLACEMENT  NOTE  Date:  11/13/2014  Patient Details  Name: Robert Morgan MRN: [id number] Date of Birth: 1927/05/21  Clinical Social Work is seeking post-discharge placement for this patient at the [place] level of care (*CSW will initial, date and re-position this form in  chart as items are completed):  Yes   Patient/family provided with [place] Work Department's list of facilities offering this level of care within the geographic area requested by the patient (or if unable, by the patient's family).  Yes   Patient/family informed of their freedom to choose among providers that offer the needed level of care, that participate in Medicare, Medicaid or managed care program needed by the patient, have an available bed and are willing to accept the patient.  Yes   Patient/family informed of [facility]'s ownership interest in [facility] and [facility], as well as of the fact that they are under no obligation to receive care at these facilities.  PASRR submitted to EDS on 11/13/14     PASRR number received on 11/13/14     Existing PASRR number confirmed on       FL2 transmitted to all facilities in geographic area requested by pt/family on 11/13/14     FL2 transmitted to all facilities within larger geographic area on       Patient informed that his/her managed care company has contracts with or will negotiate with certain facilities, including the following:            Patient/family informed of bed offers received.  Patient chooses bed at       Physician recommends and patient chooses bed at      Patient to be transferred to   on  .  Patient to be transferred to facility by       Patient family notified on   of transfer.  Name of family member notified:        PHYSICIAN       Additional Comment:    _______________________________________________ Sable Feil, LCSW 11/13/2014, 11:02  AM

## 2014-11-13 NOTE — Clinical Social Work Note (Addendum)
Clinical Social Work Assessment  Patient Details  Name: Robert Morgan MRN: [id number] Date of Birth: 13-Jul-1927  Date of referral:  11/09/14               Reason for consult:  Facility Placement                Permission sought to share information with:  Facility Sport and exercise psychologist, Family Supports (Patient gave permission for CSW and nurse case manager, Sports administrator to talk with his wife and his daughter) Permission granted to share information::     Name::     Octavio Graves and [place]::     Relationship::  Daughter and wife  Contact Information:  Ms. Kenton Kingfisher [phone]) and Mrs. Morina [phone])  Housing/Transportation Living arrangements for the past 2 months:  Apartment Source of Information:  Patient Patient Interpreter Needed:  None Criminal Activity/Legal Involvement Pertinent to Current Situation/Hospitalization:  No - Comment as needed Significant Relationships:  Spouse, Adult Children Lives with:   wife Do you feel safe going back to the place where you live?  Yes Need for family participation in patient care:  Yes (Comment)  Care giving concerns:  Talked with patient about being able to take care of himself, care for his wife and get to dialysis 3 times a week post discharge.   Social Worker assessment / plan:  On 11/12/14 CSW and nurse case manager [employer] talked with patient about discharge planning and recommendation by PT for short-term rehab. Patient was lying in bed and was awake, alert and agreeable to talking with CSW and RNCM. Reviewed PT outcomes with patient, however he still feels that he can go home and care for his wife. Discussed transportation to dialysis and patient reported that his car is currently in need of repair and he offered no other solutions to getting to dialysis treatments. As the conversation continued, patient became agreeable to [facility] re[facility] and asked to be kept informed. CSW explained facility search process and provided  patient with list of facilities in [facility]. Patient also gave permission for his daughter to be contacted and her phone number was written down and was on his bedside tray. Daughter contacted 7/26 by [facility] and discharge plans discussed with her. Ms. Kenton Kingfisher explained that she and her father are not close and she has a brother and neither she nor patient has had contact with him since 2003. Daughter agreeable to going to patient's home to get what patient will need for rehab.   CSW also received call from Glean Salvo [phone]) with [facility] in [facility]. Mrs. Bitter is their client and she goes to their Center 5 days a week. She gets aide services for 2 hours in the morning and 2 hours in the evening and the family is has to arrange for caregivers all other times. She added that the family is currently paying someone to be with Mrs. Torbert while patient is in the hospital. Ms. Beckie Salts reported that Mrs. Difatta has 2 daughters, a son (daughter-in-law) and is estranged from one of them.  Employment status:  Retired Nurse, adult PT Recommendations:    Information / Referral to community resources:  Other (Comment Required) (Not needed or requested at this time)  Patient/Family's Response to care:  Not discussed/no concerns expressed  Patient/Family's Understanding of and Emotional Response to Diagnosis, Current Treatment, and Prognosis: Not discussed.  Emotional Assessment Appearance:  Appears stated age Attitude/Demeanor/Rapport:  Other (Attitude  appropriate for interaction) Affect (typically observed):  Appropriate Orientation:  Oriented to Self, Oriented to Place, Oriented to Situation Alcohol / Substance use:  Tobacco Use (Patient reported that he quit smoking in 1982. Patient reports that he does not drink or use illicit drugs.) Psych involvement (Current and /or in the community):  No (Comment)  Discharge Needs  Concerns to be  addressed:  Discharge Planning Concerns Readmission within the last 30 days:  Yes Current discharge risk:  None Barriers to Discharge:  Waiting for outpatient dialysis, Family Issues (Patient still needs to be set-up at an outpatient dialysis center. Patient really wants to go home and care for his wife.)   Sable Feil, LCSW 11/13/2014, 10:44 AM

## 2014-11-13 NOTE — Clinical Social Work Note (Addendum)
CSW continuing to work on [place] for patient. Per Bethena Roys in dialysis, patient has not been CLIPPED yet. He will be set-up at the dialysis center in [place]. CSW visited with patient and updated him on facility search process and that placement at an outpatient dialysis center is still in process. Calls made to [facility] requesting that they review patient's information and advise if they can offer a bed. Patient remains agreeable to a facility for short-term rehab.  Robert Morgan, MSW, LCSW Licensed Clinical Social Worker [place] [phone]

## 2014-11-13 NOTE — Progress Notes (Signed)
[facility]                                                                              Patient Demographics  Robert Morgan, is a 81 y.o. male, DOB - 15-Sep-1927, PFX:[id number]  Admit date - 11/09/2014   Admitting Physician Kelvin Cellar, MD  Outpatient Primary MD for the patient is Leota Jacobsen, MD  LOS - 4   No chief complaint on file.     HPI on 11/09/2014 by Dr. Eleonore Chiquito This is a 81 year old male patient with known history of chronic kidney disease status post placement of right upper arm AV graft on June 14, history of hypertension, anemia of chronic disease, sick sinus syndrome requiring pacemaker, bladder cancer requiring urostomy. Patient presented to [facility] after experiencing extreme weakness in the past 24 hours. He felt so weak that he lay down on the floor for several hours and then opted to seek treatment. At the outside facility his creatinine was 8.1 with a bicarbonate of 14 a potassium of 4.1. Patient reports that he does not know exactly what his baseline creatinine has been it is never been as high as 8. Because of these findings and likelihood of need to initiate hemodialysis during this admission patient was transferred to [place] at [facility] also spoke with the nephrologist prior to patient being transported.  In discussion with the patient he again reemphasizes that he did not fall down but lay down on the floor because he felt so bad. He has not had any shortness of breath or change in his chronic lower extremity edema. He is not had any nausea vomiting or abdominal pain or cramping. Denied any increase or decrease in urostomy output recently. In review of Dr. Oneida Alar postoperative follow-up note on 7/7 is documented that the graft is ready for use if needed. Patient is currently in process of being prepared to begin first dialysis treatment today  Assessment & Plan   CKD (chronic kidney disease) stage 5 now ESRD -  requiring HD - CLIP in progress  - appreciate nephrology team following   Thrombocytopenia  - chronic, slightly improving, 99 today - hold Heparin SQ, placed on SCD's  - Continue to monitor CBC  Anemia of chronic disease, ESRD - aranesp per nephrology team   Hypokalemia - Resolved, address with HD - Continue to monitor BMP  Right chest pain - likely 2/2 MSK due to costochondritis. Improving - provide analgesia as needed  Metabolic Bone Disease - On calcitiol and phoslo at home.   HTN - reasonable inpatient control, continue metoprolol  AFib not on AC, CHADS2 = 3-4  - lopressor 12.5mg  BID - no AC due to high risk fall    ? UTI - Urine culture shows multiple spaces present - Continue ceftriaxone  Code Status: Full  Family Communication: none at bedside  Disposition Plan: Admitted. Pending placement and clipping.   Time Spent in minutes   30 minutes  Procedures  HD  Consults   Nephrology  DVT Prophylaxis  SCDs  Lab Results  Component Value Date   PLT 99* 11/13/2014    Medications  Scheduled Meds: . aspirin EC  81 mg Oral Daily  . atorvastatin  10 mg Oral q1800  . calcitRIOL  0.5 mcg Oral Daily  . cefTRIAXone (ROCEPHIN)  IV  1 g Intravenous Q24H  . darbepoetin (ARANESP) injection - DIALYSIS  100 mcg Intravenous Q Sat-HD  . ferric gluconate (FERRLECIT/NULECIT) IV  125 mg Intravenous Q M,W,F-HD  . metoprolol tartrate  12.5 mg Oral BID  . multivitamin  1 tablet Oral QHS  . sodium chloride  3 mL Intravenous Q12H  . triamcinolone cream  1 application Topical BID   Continuous Infusions:  PRN Meds:.sodium chloride, acetaminophen **OR** acetaminophen, feeding supplement (NEPRO CARB STEADY), heparin, lidocaine (PF), lidocaine-prilocaine, ondansetron **OR** ondansetron (ZOFRAN) IV, oxyCODONE-acetaminophen, pentafluoroprop-tetrafluoroeth, temazepam  Antibiotics    Anti-infectives    Start     Dose/Rate Route Frequency Ordered Stop   11/10/14 1630   cefTRIAXone (ROCEPHIN) 1 g in dextrose 5 % 50 mL IVPB - Premix     1 g 100 mL/hr over 30 Minutes Intravenous Every 24 hours 11/10/14 1546          Subjective:   Robert Morgan seen and examined today.  Patient denies any chest pain or shortness of breath, abdominal pain, nausea or vomiting, diarrhea or constipation. States he wants to go home now and we are holding him "against his will."  Objective:   Filed Vitals:   11/13/14 1230 11/13/14 1300 11/13/14 1330 11/13/14 1356  BP: 107/58 121/60 120/60 136/68  Pulse: 71 72 76 76  Temp:    98 F (36.7 C)  TempSrc:    Oral  Resp:    18  Height:      Weight:    70.7 kg (155 lb 13.8 oz)  SpO2:    98%    Wt Readings from Last 3 Encounters:  11/13/14 70.7 kg (155 lb 13.8 oz)  10/24/14 72.122 kg (159 lb)  10/01/14 77.565 kg (171 lb)     Intake/Output Summary (Last 24 hours) at 11/13/14 1448 Last data filed at 11/13/14 1356  Gross per 24 hour  Intake    630 ml  Output   1100 ml  Net   -470 ml    Exam  General: Well developed, well nourished, NAD, appears stated age  52: NCAT,  mucous membranes moist.   Cardiovascular: S1 S2 auscultated, 2/6 SEM, Regular rate and rhythm.  Respiratory: Clear to auscultation bilaterally with equal chest rise  Abdomen: Soft, nontender, nondistended, + bowel sounds  Extremities: warm dry without cyanosis clubbing or edema  Neuro: AAOx3, nonfocal  Data Review   Micro Results Recent Results (from the past 240 hour(s))  Urine culture     Status: None   Collection Time: 11/09/14  8:54 PM  Result Value Ref Range Status   Specimen Description URINE, CLEAN CATCH  Final   Special Requests NONE  Final   Culture MULTIPLE SPECIES PRESENT, SUGGEST RECOLLECTION  Final   Report Status 11/12/2014 FINAL  Final    Radiology Reports No results found.  CBC  Recent Labs Lab 11/09/14 1650 11/11/14 0820 11/12/14 0528 11/13/14 0515  WBC 5.2 4.0 4.0 4.3  HGB 9.2* 9.0* 9.1* 9.8*  HCT 28.6*  27.9* 28.5* 31.1*  PLT 101* 88* 81* 99*  MCV 92.0 93.6 95.0 95.7  MCH 29.6 30.2 30.3 30.2  MCHC 32.2 32.3 31.9 31.5  RDW 14.3 14.1 14.1 14.1  LYMPHSABS 0.7  --   --   --   MONOABS 0.5  --   --   --   EOSABS  0.1  --   --   --   BASOSABS 0.0  --   --   --     Chemistries   Recent Labs Lab 11/09/14 1648 11/11/14 0820 11/12/14 0528 11/13/14 0515  NA 141 136 135 140  K 3.5 3.0* 3.2* 3.8  CL 109 103 102 105  CO2 18* 23 24 24   GLUCOSE 112* 109* 98 102*  BUN 112* 77* 47* 65*  CREATININE 7.67* 6.20* 4.42* 5.58*  CALCIUM 7.8* 7.2* 7.3* 7.8*   ------------------------------------------------------------------------------------------------------------------ estimated creatinine clearance is 8.7 mL/min (by C-G formula based on Cr of 5.58). ------------------------------------------------------------------------------------------------------------------ No results for input(s): HGBA1C in the last 72 hours. ------------------------------------------------------------------------------------------------------------------ No results for input(s): CHOL, HDL, LDLCALC, TRIG, CHOLHDL, LDLDIRECT in the last 72 hours. ------------------------------------------------------------------------------------------------------------------ No results for input(s): TSH, T4TOTAL, T3FREE, THYROIDAB in the last 72 hours.  Invalid input(s): FREET3 ------------------------------------------------------------------------------------------------------------------ No results for input(s): VITAMINB12, FOLATE, FERRITIN, TIBC, IRON, RETICCTPCT in the last 72 hours.  Coagulation profile No results for input(s): INR, PROTIME in the last 168 hours.  No results for input(s): DDIMER in the last 72 hours.  Cardiac Enzymes No results for input(s): CKMB, TROPONINI, MYOGLOBIN in the last 168 hours.  Invalid input(s):  CK ------------------------------------------------------------------------------------------------------------------ Invalid input(s): POCBNP    Kambrie Eddleman D.O. on 11/13/2014 at 2:48 PM  Between 7am to 7pm - Pager - [phone]  After 7pm go to [email or web address] - password [email or web address]  And look for the night coverage person covering for me after hours  [facility] Office  [phone]

## 2014-11-13 NOTE — Procedures (Signed)
I have seen and examined this patient and agree with the plan of care . No issues with dialysis  [facility] W 11/13/2014, 8:23 PM

## 2014-11-13 NOTE — Progress Notes (Signed)
Subjective: Interval History: resting comfortably  Objective: Vital signs in last 24 hours: Temp:  [97.3 F (36.3 C)-98.1 F (36.7 C)] 97.5 F (36.4 C) (07/27 0820) Pulse Rate:  [70-79] 72 (07/27 0820) Resp:  [17-18] 18 (07/27 0820) BP: (118-144)/(53-66) 132/54 mmHg (07/27 0820) SpO2:  [98 %-100 %] 98 % (07/27 0820) Weight:  [153 lb 3.5 oz (69.5 kg)] 153 lb 3.5 oz (69.5 kg) (07/26 2117) Weight change: -3 lb 1.4 oz (-1.4 kg)  Intake/Output from previous day: 07/26 0701 - 07/27 0700 In: 1320 [P.O.:1270; IV Piggyback:50] Out: 200 [Urine:200] Intake/Output this shift: Total I/O In: 120 [P.O.:120] Out: 0   Physical Exam  Constitutional: He appears well-developed and well-nourished. No distress.  HENT:  Head: Normocephalic and atraumatic.  Mouth/Throat: Oropharynx is clear and moist.  Eyes: Conjunctivae are normal.  Cardiovascular: Normal rate and regular rhythm.   No murmur heard. Pulmonary/Chest: Effort normal and breath sounds normal. No respiratory distress. He has no wheezes. He has no rales. He exhibits no tenderness.  Abdominal: Soft. Bowel sounds are normal. He exhibits no distension. There is no tenderness.  Skin: Skin is warm and dry.    Lab Results:  Recent Labs  11/12/14 0528 11/13/14 0515  WBC 4.0 4.3  HGB 9.1* 9.8*  HCT 28.5* 31.1*  PLT 81* 99*   BMET:   Recent Labs  11/12/14 0528 11/13/14 0515  NA 135 140  K 3.2* 3.8  CL 102 105  CO2 24 24  GLUCOSE 98 102*  BUN 47* 65*  CREATININE 4.42* 5.58*  CALCIUM 7.3* 7.8*     Studies/Results: No results found.  Scheduled: . aspirin EC  81 mg Oral Daily  . atorvastatin  10 mg Oral q1800  . calcitRIOL  0.5 mcg Oral Daily  . cefTRIAXone (ROCEPHIN)  IV  1 g Intravenous Q24H  . darbepoetin (ARANESP) injection - DIALYSIS  100 mcg Intravenous Q Sat-HD  . ferric gluconate (FERRLECIT/NULECIT) IV  125 mg Intravenous Q M,W,F-HD  . metoprolol tartrate  12.5 mg Oral BID  . multivitamin  1 tablet Oral QHS   . sodium chloride  3 mL Intravenous Q12H  . triamcinolone cream  1 application Topical BID    Assessment/Plan: 1. ESRD newly started on HD this admission- scheduled to have low volume HD today - CLIP in process 2. Right chest pain- likely 2/2 MSK due to costochondritis. 3. Thromboycytopenia-chornic 4. Anemia of chronic kidney disease- cont aranesp  5. Metabolic Bone Disease:On calcitiol and phoslo at home.  6. HTN- per primary 7. AFib not on [facility]- lopressor 12.5mg  BID 8. [place] [facility]    LOS: 4 days   [name] 11/13/2014,10:40 AM

## 2014-11-14 LAB — BASIC METABOLIC PANEL
Anion gap: 9 (ref 5–15)
BUN: 33 mg/dL — ABNORMAL HIGH (ref 6–20)
CO2: 24 mmol/L (ref 22–32)
CREATININE: 3.78 mg/dL — AB (ref 0.61–1.24)
Calcium: 8.1 mg/dL — ABNORMAL LOW (ref 8.9–10.3)
Chloride: 104 mmol/L (ref 101–111)
GFR calc non Af Amer: 13 mL/min — ABNORMAL LOW (ref >60)
GFR, EST AFRICAN AMERICAN: 15 mL/min — AB (ref >60)
Glucose, Bld: 103 mg/dL — ABNORMAL HIGH (ref 65–99)
Potassium: 3.9 mmol/L (ref 3.5–5.1)
SODIUM: 137 mmol/L (ref 135–145)

## 2014-11-14 LAB — CBC
HEMATOCRIT: 30.7 % — AB (ref 39.0–52.0)
Hemoglobin: 9.7 g/dL — ABNORMAL LOW (ref 13.0–17.0)
MCH: 30.5 pg (ref 26.0–34.0)
MCHC: 31.6 g/dL (ref 30.0–36.0)
MCV: 96.5 fL (ref 78.0–100.0)
PLATELETS: 97 10*3/uL — AB (ref 150–400)
RBC: 3.18 MIL/uL — AB (ref 4.22–5.81)
RDW: 14.2 % (ref 11.5–15.5)
WBC: 4.5 10*3/uL (ref 4.0–10.5)

## 2014-11-14 NOTE — Progress Notes (Signed)
[facility]                                                                              Patient Demographics  Robert Morgan, is a 81 y.o. male, DOB - 1927/11/09, GBT:[id number]  Admit date - 11/09/2014   Admitting Physician Robert Cellar, MD  Outpatient Primary MD for the patient is Robert Jacobsen, MD  LOS - 5   No chief complaint on file.     HPI on 11/09/2014 by Dr. Eleonore Morgan This is a 81 year old male patient with known history of chronic kidney disease status post placement of right upper arm AV graft on June 14, history of hypertension, anemia of chronic disease, sick sinus syndrome requiring pacemaker, bladder cancer requiring urostomy. Patient presented to [facility] after experiencing extreme weakness in the past 24 hours. He felt so weak that he lay down on the floor for several hours and then opted to seek treatment. At the outside facility his creatinine was 8.1 with a bicarbonate of 14 a potassium of 4.1. Patient reports that he does not know exactly what his baseline creatinine has been it is never been as high as 8. Because of these findings and likelihood of need to initiate hemodialysis during this admission patient was transferred to [place] at [facility] also spoke with the nephrologist prior to patient being transported.  In discussion with the patient he again reemphasizes that he did not fall down but lay down on the floor because he felt so bad. He has not had any shortness of breath or change in his chronic lower extremity edema. He is not had any nausea vomiting or abdominal pain or cramping. Denied any increase or decrease in urostomy output recently. In review of Dr. Oneida Morgan postoperative follow-up note on 7/7 is documented that the graft is ready for use if needed. Patient is currently in process of being prepared to begin first dialysis treatment today  Assessment & Plan   CKD (chronic kidney disease) stage 5 now ESRD -  requiring HD - CLIP in progress  - appreciate nephrology team following   Thrombocytopenia  - chronic, slightly improving, 97 today - hold Heparin SQ, placed on SCD's  - Continue to monitor CBC  Anemia of chronic disease, ESRD - aranesp per nephrology team  - hb currently 9.7  Hypokalemia - Resolved, address with HD - Continue to monitor BMP  Right chest pain - likely 2/2 MSK due to costochondritis. Improving - provide analgesia as needed  Metabolic Bone Disease - On calcitiol and phoslo at home.   HTN - reasonable inpatient control, continue metoprolol  AFib not on AC, CHADS2 = 3-4  - lopressor 12.5mg  BID - no AC due to high risk fall    ? UTI - Urine culture shows multiple spaces present - Continue ceftriaxone  Code Status: Full  Family Communication: none at bedside. Spoke with daughter via phone.  Disposition Plan: Admitted. Pending placement and clipping.   Time Spent in minutes   30 minutes  Procedures  HD  Consults   Nephrology  DVT Prophylaxis  SCDs  Lab Results  Component Value Date   PLT 97* 11/14/2014  Medications  Scheduled Meds: . aspirin EC  81 mg Oral Daily  . atorvastatin  10 mg Oral q1800  . calcitRIOL  0.5 mcg Oral Daily  . cefTRIAXone (ROCEPHIN)  IV  1 g Intravenous Q24H  . darbepoetin (ARANESP) injection - DIALYSIS  100 mcg Intravenous Q Sat-HD  . ferric gluconate (FERRLECIT/NULECIT) IV  125 mg Intravenous Q M,W,F-HD  . metoprolol tartrate  12.5 mg Oral BID  . multivitamin  1 tablet Oral QHS  . sodium chloride  3 mL Intravenous Q12H  . triamcinolone cream  1 application Topical BID   Continuous Infusions:  PRN Meds:.sodium chloride, acetaminophen **OR** acetaminophen, feeding supplement (NEPRO CARB STEADY), heparin, lidocaine (PF), lidocaine-prilocaine, ondansetron **OR** ondansetron (ZOFRAN) IV, oxyCODONE-acetaminophen, pentafluoroprop-tetrafluoroeth, temazepam  Antibiotics    Anti-infectives    Start      Dose/Rate Route Frequency Ordered Stop   11/10/14 1630  cefTRIAXone (ROCEPHIN) 1 g in dextrose 5 % 50 mL IVPB - Premix     1 g 100 mL/hr over 30 Minutes Intravenous Every 24 hours 11/10/14 1546          Subjective:   Robert Morgan seen and examined today.  Patient denies any chest pain or shortness of breath, abdominal pain, nausea or vomiting, diarrhea or constipation. States he wants to go home.   Objective:   Filed Vitals:   11/13/14 1449 11/13/14 2108 11/14/14 0513 11/14/14 0910  BP: 151/64 138/52 120/46 138/59  Pulse: 81 70 69 79  Temp: 97.4 F (36.3 C) 97.9 F (36.6 C) 97.7 F (36.5 C) 98.4 F (36.9 C)  TempSrc: Oral Oral Oral Oral  Resp: 18 17 18 18   Height:      Weight:  70.7 kg (155 lb 13.8 oz)    SpO2: 97% 98% 96% 97%    Wt Readings from Last 3 Encounters:  11/13/14 70.7 kg (155 lb 13.8 oz)  10/24/14 72.122 kg (159 lb)  10/01/14 77.565 kg (171 lb)     Intake/Output Summary (Last 24 hours) at 11/14/14 1219 Last data filed at 11/14/14 1028  Gross per 24 hour  Intake    600 ml  Output   1325 ml  Net   -725 ml    Exam  General: Well developed, well nourished, no distress  HEENT: NCAT,  mucous membranes moist.   Cardiovascular: S1 S2 auscultated, 2/6 SEM, RRR  Respiratory: Clear to auscultation   Abdomen: Soft, nontender, nondistended, + bowel sounds  Extremities: warm dry without cyanosis clubbing or edema  Neuro: AAOx3, nonfocal  Data Review   Micro Results Recent Results (from the past 240 hour(s))  Urine culture     Status: None   Collection Time: 11/09/14  8:54 PM  Result Value Ref Range Status   Specimen Description URINE, CLEAN CATCH  Final   Special Requests NONE  Final   Culture MULTIPLE SPECIES PRESENT, SUGGEST RECOLLECTION  Final   Report Status 11/12/2014 FINAL  Final    Radiology Reports No results found.  CBC  Recent Labs Lab 11/09/14 1650 11/11/14 0820 11/12/14 0528 11/13/14 0515 11/14/14 0509  WBC 5.2 4.0 4.0  4.3 4.5  HGB 9.2* 9.0* 9.1* 9.8* 9.7*  HCT 28.6* 27.9* 28.5* 31.1* 30.7*  PLT 101* 88* 81* 99* 97*  MCV 92.0 93.6 95.0 95.7 96.5  MCH 29.6 30.2 30.3 30.2 30.5  MCHC 32.2 32.3 31.9 31.5 31.6  RDW 14.3 14.1 14.1 14.1 14.2  LYMPHSABS 0.7  --   --   --   --  MONOABS 0.5  --   --   --   --   EOSABS 0.1  --   --   --   --   BASOSABS 0.0  --   --   --   --     Chemistries   Recent Labs Lab 11/09/14 1648 11/11/14 0820 11/12/14 0528 11/13/14 0515 11/14/14 0509  NA 141 136 135 140 137  K 3.5 3.0* 3.2* 3.8 3.9  CL 109 103 102 105 104  CO2 18* 23 24 24 24   GLUCOSE 112* 109* 98 102* 103*  BUN 112* 77* 47* 65* 33*  CREATININE 7.67* 6.20* 4.42* 5.58* 3.78*  CALCIUM 7.8* 7.2* 7.3* 7.8* 8.1*   ------------------------------------------------------------------------------------------------------------------ estimated creatinine clearance is 12.9 mL/min (by C-G formula based on Cr of 3.78). ------------------------------------------------------------------------------------------------------------------ No results for input(s): HGBA1C in the last 72 hours. ------------------------------------------------------------------------------------------------------------------ No results for input(s): CHOL, HDL, LDLCALC, TRIG, CHOLHDL, LDLDIRECT in the last 72 hours. ------------------------------------------------------------------------------------------------------------------ No results for input(s): TSH, T4TOTAL, T3FREE, THYROIDAB in the last 72 hours.  Invalid input(s): FREET3 ------------------------------------------------------------------------------------------------------------------ No results for input(s): VITAMINB12, FOLATE, FERRITIN, TIBC, IRON, RETICCTPCT in the last 72 hours.  Coagulation profile No results for input(s): INR, PROTIME in the last 168 hours.  No results for input(s): DDIMER in the last 72 hours.  Cardiac Enzymes No results for input(s): CKMB, TROPONINI,  MYOGLOBIN in the last 168 hours.  Invalid input(s): CK ------------------------------------------------------------------------------------------------------------------ Invalid input(s): POCBNP    Carlosdaniel Grob D.O. on 11/14/2014 at 12:19 PM  Between 7am to 7pm - Pager - [phone]  After 7pm go to [email or web address] - password [email or web address]  And look for the night coverage person covering for me after hours  [facility] Office  [phone]

## 2014-11-14 NOTE — Progress Notes (Signed)
Physical Therapy Treatment Patient Details Name: Robert Morgan MRN: [id number] DOB: [birth year] Today's Date: 11/14/2014    History of Present Illness 81 year old male patient with known history of chronic kidney disease status post placement of right upper arm AV graft on June 14th, 2016, history of hypertension, anemia of chronic disease, sick sinus syndrome requiring pacemaker, bladder cancer requiring urostomy, transferred from [facility] for considering initiating HD at [facility]    PT Comments    Patient making progress with mobility and gait.  Continue to be concerned about patient's balance, and ability to provide care for his wife.  Also concerned that patient reports he could drive himself to dialysis.  States he has neighbors that will help out, however concerned that help will be available 3 days/week for long-term.  Recommend ST-SNF for continued therapy with goal to return home, or consider ALF for patient/wife.   Follow Up Recommendations  SNF;Supervision/Assistance - 24 hour     Equipment Recommendations  Rolling walker with 5" wheels;3in1 (PT)    Recommendations for Other Services       Precautions / Restrictions Precautions Precautions: Fall Restrictions Weight Bearing Restrictions: No    Mobility  Bed Mobility                  Transfers Overall transfer level: Needs assistance Equipment used: Rolling walker (2 wheeled);None Transfers: Sit to/from Stand Sit to Stand: Min guard         General transfer comment: Patient useing safe technique with RW.  Good balance in static stance.  Ambulation/Gait Ambulation/Gait assistance: Min guard Ambulation Distance (Feet): 180 Feet (180' x3 with sitting rest breaks between each) Assistive device: Rolling walker (2 wheeled);None Gait Pattern/deviations: Step-through pattern;Decreased stride length;Shuffle;Trunk flexed Gait velocity: Decreased Gait velocity interpretation: Below normal speed for  age/gender General Gait Details: Verbal cues for safe use of RW for first 2 walks of 180' each.  Patient then ambulated 180' with no assistive device.  Patient with short step lengths and at times shuffling steps.  Was able to increase step length with verbal cues for 20', then returned to shorter steps.  Slightly unsteady gait.   Stairs            Wheelchair Mobility    Modified Rankin (Stroke Patients Only)       Balance Overall balance assessment: Needs assistance         Standing balance support: No upper extremity supported Standing balance-Leahy Scale: Fair                      Cognition Arousal/Alertness: Awake/alert Behavior During Therapy: Anxious Overall Cognitive Status: No family/caregiver present to determine baseline cognitive functioning Area of Impairment: Safety/judgement         Safety/Judgement: Decreased awareness of safety;Decreased awareness of deficits     General Comments: Patient wanting to go home.  When asked how he would get to HD, he started talking about driving himself.  Poor insight into deficits and safety.    Exercises      General Comments        Pertinent Vitals/Pain Pain Assessment: No/denies pain    Home Living                      Prior Function            PT Goals (current goals can now be found in the care plan section) Progress towards PT goals: Progressing toward goals  Frequency  Min 3X/week    PT Plan Current plan remains appropriate    Co-evaluation             End of Session Equipment Utilized During Treatment: Gait belt Activity Tolerance: Patient tolerated treatment well Patient left: in chair;with call bell/phone within reach;with chair alarm set     Time: 8341-9622 PT Time Calculation (min) (ACUTE ONLY): 31 min  Charges:  $Gait Training: 23-37 mins                    G Codes:      Despina Pole Nov 18, 2014, 7:24 PM Carita Pian. Sanjuana Kava, [place] Pager [phone]

## 2014-11-14 NOTE — Progress Notes (Signed)
Subjective: Interval History: resting comfortably  Objective: Vital signs in last 24 hours: Temp:  [97.4 F (36.3 C)-98.4 F (36.9 C)] 98.4 F (36.9 C) (07/28 0910) Pulse Rate:  [69-81] 79 (07/28 0910) Resp:  [17-18] 18 (07/28 0910) BP: (120-151)/(46-64) 138/59 mmHg (07/28 0910) SpO2:  [96 %-98 %] 97 % (07/28 0910) Weight:  [155 lb 13.8 oz (70.7 kg)] 155 lb 13.8 oz (70.7 kg) (07/27 2108) Weight change: 4 lb 13.6 oz (2.2 kg)  Intake/Output from previous day: 07/27 0701 - 07/28 0700 In: 480 [P.O.:480] Out: 1225 [Urine:325] Intake/Output this shift: Total I/O In: 360 [P.O.:360] Out: 100 [Urine:100]  Physical Exam  Constitutional: He appears well-developed and well-nourished. No distress.  HENT:  Head: Normocephalic and atraumatic.  Mouth/Throat: Oropharynx is clear and moist.  Eyes: Conjunctivae are normal.  Cardiovascular: Normal rate and regular rhythm.   No murmur heard. Pulmonary/Chest: Effort normal and breath sounds normal. No respiratory distress. He has no wheezes. He has no rales. He exhibits no tenderness.  Abdominal: Soft. Bowel sounds are normal. He exhibits no distension. There is no tenderness.  Skin: Skin is warm and dry.    Lab Results:  Recent Labs  11/13/14 0515 11/14/14 0509  WBC 4.3 4.5  HGB 9.8* 9.7*  HCT 31.1* 30.7*  PLT 99* 97*   BMET:   Recent Labs  11/13/14 0515 11/14/14 0509  NA 140 137  K 3.8 3.9  CL 105 104  CO2 24 24  GLUCOSE 102* 103*  BUN 65* 33*  CREATININE 5.58* 3.78*  CALCIUM 7.8* 8.1*     Studies/Results: No results found.  Scheduled: . aspirin EC  81 mg Oral Daily  . atorvastatin  10 mg Oral q1800  . calcitRIOL  0.5 mcg Oral Daily  . cefTRIAXone (ROCEPHIN)  IV  1 g Intravenous Q24H  . darbepoetin (ARANESP) injection - DIALYSIS  100 mcg Intravenous Q Sat-HD  . ferric gluconate (FERRLECIT/NULECIT) IV  125 mg Intravenous Q M,W,F-HD  . metoprolol tartrate  12.5 mg Oral BID  . multivitamin  1 tablet Oral QHS  .  sodium chloride  3 mL Intravenous Q12H  . triamcinolone cream  1 application Topical BID    Assessment/Plan: 1. ESRD newly started on HD this admission- tolerating HD well.  - HD tomorrow - CLIP in process 2. Right chest pain- likely 2/2 MSK due to costochondritis. 3. Thromboycytopenia-chornic 4. Anemia of chronic kidney disease- cont aranesp  5. Metabolic Bone Disease:On calcitiol and phoslo at home.  6. HTN- per primary 7. AFib not on [facility]- lopressor 12.5mg  BID 8. [place] [facility]    LOS: 5 days   [name] 11/14/2014,2:05 PM

## 2014-11-14 NOTE — Progress Notes (Signed)
Occupational Therapy Treatment Patient Details Name: TANYON ALIPIO MRN: [id number] DOB: October 01, 1927 Today's Date: 11/14/2014    History of present illness 81 year old male patient with known history of chronic kidney disease status post placement of right upper arm AV graft on June 14th, 2016, history of hypertension, anemia of chronic disease, sick sinus syndrome requiring pacemaker, bladder cancer requiring urostomy, transferred from [facility] for considering initiating HD at [facility]   OT comments  Pt stating he feels anxious and does not want to stay in the hospital as he feels fine.  Pt able to donn and doff socks in sitting and ambulate in small room with min to min guard assist.  Reaches for furniture or OT's hand and reports that the floor is slick and that's why his balance in impaired.  Assisted pt to walk in room and sit up in chair near window. Will continue to follow.  Follow Up Recommendations  SNF;Supervision/Assistance - 24 hour    Equipment Recommendations       Recommendations for Other Services      Precautions / Restrictions Precautions Precautions: Fall Restrictions Weight Bearing Restrictions: No       Mobility Bed Mobility Overal bed mobility: Modified Independent                Transfers Overall transfer level: Needs assistance   Transfers: Sit to/from Stand Sit to Stand: Min guard              Balance             Standing balance-Leahy Scale: Poor                     ADL Overall ADL's : Needs assistance/impaired                 Upper Body Dressing : Set up;Sitting   Lower Body Dressing: Min guard;Sit to/from stand Lower Body Dressing Details (indicate cue type and reason): donned and doffed socks Toilet Transfer: Minimal assistance;Ambulation           Functional mobility during ADLs: Minimal assistance (did not use device in pt's small room) General ADL Comments: Pt cooperative, but reports feeling  anxious in hospital and does not understand why he can't go home.      Vision                     Perception     Praxis      Cognition   Behavior During Therapy: [facility] for tasks assessed/performed Overall Cognitive Status: No family/caregiver present to determine baseline cognitive functioning Area of Impairment: Safety/judgement          Safety/Judgement: Decreased awareness of safety;Decreased awareness of deficits     General Comments: Pt wanting to go home. Not aware of his deficits, fall risk.    Extremity/Trunk Assessment               Exercises     Shoulder Instructions       General Comments      Pertinent Vitals/ Pain       Pain Assessment: No/denies pain  Home Living                                          Prior Functioning/Environment              Frequency Min  2X/week     Progress Toward Goals  OT Goals(current goals can now be found in the care plan section)  Progress towards OT goals: Progressing toward goals  Acute Rehab OT Goals Patient Stated Goal: wants to go home Time For Goal Achievement: 11/19/14 Potential to Achieve Goals: Good  Plan Discharge plan remains appropriate    Co-evaluation                 End of Session     Activity Tolerance Patient tolerated treatment well   Patient Left in chair;with call bell/phone within reach;with chair alarm set   Nurse Communication          Time: 1458-1530 OT Time Calculation (min): 32 min  Charges: OT General Charges $OT Visit: 1 Procedure OT Treatments $Self Care/Home Management : 23-37 mins  Malka So 11/14/2014, 3:41 PM  [phone]

## 2014-11-14 NOTE — Clinical Social Work Note (Signed)
CSW talked with patient today regarding discharge to a facility and presented him with facility responses. Robert Morgan informed CSW that he is going home and this was discussed along with CSW's concern regarding patient being able to care for himself-including walking safely, transferring, etc.; caring for his wife and driving to dialysis. Patient reported that he will get someone to drive him to dialysis and does not anticipate any problems in walking, getting out of bed or doing other things he needs to do. A call was placed to Dr. Justin Mend in nephrology as his dialysis has not been set-up, pending facility placement. He ordered a psych evaluation on patient for capacity. CSW will continue to follow and work with other staff on an appropriate discharge plan for patient.   Robert Morgan, MSW, LCSW Licensed Clinical Social Worker [place] [phone]

## 2014-11-15 DIAGNOSIS — F4323 Adjustment disorder with mixed anxiety and depressed mood: Secondary | ICD-10-CM | POA: Clinically undetermined

## 2014-11-15 DIAGNOSIS — Z992 Dependence on renal dialysis: Secondary | ICD-10-CM

## 2014-11-15 DIAGNOSIS — N186 End stage renal disease: Secondary | ICD-10-CM

## 2014-11-15 LAB — CBC
HCT: 29 % — ABNORMAL LOW (ref 39.0–52.0)
HEMOGLOBIN: 9.1 g/dL — AB (ref 13.0–17.0)
MCH: 30.2 pg (ref 26.0–34.0)
MCHC: 31.4 g/dL (ref 30.0–36.0)
MCV: 96.3 fL (ref 78.0–100.0)
Platelets: 109 10*3/uL — ABNORMAL LOW (ref 150–400)
RBC: 3.01 MIL/uL — ABNORMAL LOW (ref 4.22–5.81)
RDW: 14.2 % (ref 11.5–15.5)
WBC: 4.7 10*3/uL (ref 4.0–10.5)

## 2014-11-15 LAB — BASIC METABOLIC PANEL
Anion gap: 10 (ref 5–15)
BUN: 45 mg/dL — ABNORMAL HIGH (ref 6–20)
CHLORIDE: 101 mmol/L (ref 101–111)
CO2: 26 mmol/L (ref 22–32)
Calcium: 8.3 mg/dL — ABNORMAL LOW (ref 8.9–10.3)
Creatinine, Ser: 4.89 mg/dL — ABNORMAL HIGH (ref 0.61–1.24)
GFR, EST AFRICAN AMERICAN: 11 mL/min — AB (ref >60)
GFR, EST NON AFRICAN AMERICAN: 10 mL/min — AB (ref >60)
Glucose, Bld: 101 mg/dL — ABNORMAL HIGH (ref 65–99)
Potassium: 4.1 mmol/L (ref 3.5–5.1)
Sodium: 137 mmol/L (ref 135–145)

## 2014-11-15 MED ORDER — NEPRO/CARBSTEADY PO LIQD
237.0000 mL | ORAL | Status: DC | PRN
  Filled 2014-11-15: qty 237

## 2014-11-15 MED ORDER — LIDOCAINE HCL (PF) 1 % IJ SOLN: 5.0000 mL | INTRAMUSCULAR | Status: DC | PRN

## 2014-11-15 MED ORDER — LIDOCAINE-PRILOCAINE 2.5-2.5 % EX CREA
1.0000 "application " | TOPICAL_CREAM | CUTANEOUS | Status: DC | PRN
  Filled 2014-11-15: qty 5

## 2014-11-15 MED ORDER — PENTAFLUOROPROP-TETRAFLUOROETH EX AERO: 1.0000 "application " | INHALATION_SPRAY | CUTANEOUS | Status: DC | PRN

## 2014-11-15 MED ORDER — HEPARIN SODIUM (PORCINE) 1000 UNIT/ML DIALYSIS: 1000.0000 [IU] | INTRAMUSCULAR | Status: DC | PRN

## 2014-11-15 MED ORDER — SODIUM CHLORIDE 0.9 % IV SOLN
100.0000 mL | INTRAVENOUS | Status: DC | PRN
Start: 2014-11-15 — End: 2014-11-15

## 2014-11-15 MED ORDER — CALCITRIOL 0.5 MCG PO CAPS
ORAL_CAPSULE | ORAL | Status: AC
  Administered 2014-11-15: 0.5 ug via ORAL
  Filled 2014-11-15: qty 1

## 2014-11-15 MED ORDER — ALTEPLASE 2 MG IJ SOLR
2.0000 mg | Freq: Once | INTRAMUSCULAR | Status: DC | PRN
  Filled 2014-11-15: qty 2

## 2014-11-15 NOTE — Progress Notes (Signed)
PT Cancellation Note  Patient Details Name: Robert Morgan MRN: [id number] DOB: 1927/08/17   Cancelled Treatment:    Reason Eval/Treat Not Completed: Patient at procedure or test/unavailable.  Patient in HD.  Will return at later time.   Despina Pole 11/15/2014, 3:42 PM Carita Pian. Sanjuana Kava, [place] Pager [phone]

## 2014-11-15 NOTE — Discharge Instructions (Signed)
End-Stage Kidney Disease °The kidneys are two organs that lie on either side of the spine between the middle of the back and the front of the abdomen. The kidneys:  °· Remove wastes and extra water from the blood.   °· Produce important hormones. These help keep bones strong, regulate blood pressure, and help create red blood cells.   °· Balance the fluids and chemicals in the blood and tissues. °End-stage kidney disease occurs when the kidneys are so damaged that they cannot do their job. When the kidneys cannot do their job, life-threatening problems occur. The body cannot stay clean and strong without the help of the kidneys. In end-stage kidney disease, the kidneys cannot get better. You need a new kidney or treatments to do some of the work healthy kidneys do in order to stay alive. °CAUSES  °End-stage kidney disease usually occurs when a long-lasting (chronic) kidney disease gets worse. It may also occur after the kidneys are suddenly damaged (acute kidney injury).  °SYMPTOMS  °· Swelling (edema) of the legs, ankles, or feet.   °· Tiredness (lethargy).   °· Nausea or vomiting.   °· Confusion.   °· Problems with urination, such as:   °¨ Decreased urine production.   °¨ Frequent urination, especially at night.   °¨ Frequent accidents in children who are potty trained.   °· Muscle twitches and cramps.   °· Persistent itchiness.   °· Loss of appetite.   °· Headaches.   °· Abnormally dark or light skin.   °· Numbness in the hands or feet.   °· Easy bruising.   °· Frequent hiccups.   °· Menstruation stops. °DIAGNOSIS  °Your health care provider will measure your blood pressure and take some tests. These may include:  °· Urine tests.   °· Blood tests.   °· Imaging tests, such as:   °¨ An ultrasound exam.   °¨ Computed tomography (CT). °· A kidney biopsy. °TREATMENT  °There are two treatments for end-stage kidney disease:  °· A procedure that removes toxic wastes from the body (dialysis).   °· Receiving a new kidney  (kidney transplant). °Both of these treatments have serious risks and consequences. Your health care provider will help you determine which treatment is best for you based on your health, age, and other factors. In addition to having dialysis or a kidney transplant, you may need to take medicines to control high blood pressure (hypertension) and cholesterol and to decrease phosphorus levels in your blood.  °HOME CARE INSTRUCTIONS °· Follow your prescribed diet.   °· Take medicines only as directed by your health care provider.   °· Do not take any new medicines (prescription, over-the-counter, or nutritional supplements) unless approved by your health care provider. Many medicines can worsen your kidney damage or need to have the dose adjusted.   °· Keep all follow-up visits as directed by your health care provider. °MAKE SURE YOU: °· Understand these instructions. °· Will watch your condition. °· Will get help right away if you are not doing well or get worse. °Document Released: 06/26/2003 Document Revised: 08/20/2013 Document Reviewed: 12/03/2011 °Exit[employer]® Patient Information ©2015 [employer]. This information is not intended to replace advice given to you by your health care provider. Make sure you discuss any questions you have with your health care provider. ° °

## 2014-11-15 NOTE — Progress Notes (Signed)
Hemodialysis=will be mon.wed,fri @[facility]  2nd shift can start 18 Nov 2014 @11am 

## 2014-11-15 NOTE — Consult Note (Signed)
[facility] Consult   Reason for Consult:  Capacity evaluation Referring Physician:  Dr. Ree Kida Patient Identification: JOHNATHON OLDEN MRN:  [id number] Principal Diagnosis: Adjustment disorder with mixed anxiety and depressed mood Diagnosis:   Patient Active Problem List   Diagnosis Date Noted  . Adjustment disorder with mixed anxiety and depressed mood [F43.23] 11/15/2014  . CKD (chronic kidney disease) stage 5, GFR less than 15 ml/min [N18.5] 11/09/2014  . HTN (hypertension) [I10] 11/09/2014  . History of pacemaker [Z86.79] 11/09/2014  . History of bladder cancer [Z85.51] 11/09/2014  . Presence of urostomy [Z93.6] 11/09/2014  . Anemia in chronic kidney disease (CKD) [N18.9, D63.1] 11/09/2014    Total Time spent with patient: 1 hour  Subjective:   AUREN VALDES is a 81 y.o. male patient admitted with extreme weakness and CKD.  HPI:  Amro Winebarger is a 81 year old male seen face-to-face for this psychiatric consultation and evaluation and reviewed available medical records for capacity evaluation. Case discussed with patient staff RN and case manager on the unit. Patient reported he was admitted to [facility] secondary to generalized weakness and then found to have chronic kidney disease. Patient has been receiving hemodialysis which made him feel better and now he is willing to go to home. Patient stated he wanted to go home because he has to take care of his 77 years old wife who was required every 24 hours care. Patient reported has a few neighbors and people helping him in the community. Patient does not want to be placed out of home. Review of cognitive functions patient has intact cognitions regarding pa and his orientation, concentration, immediate and delayed memory and language functions. Patient has fairly good understanding about his medical conditions and needed treatment and willing to follow up with outpatient care. Patient sleeps in a small community  [place], [place]. Patient is able to get out of his bed without assistance and also able to walk both with the help of walker and without walker in and out of his room. Patient is also aware of his options about going to nursing home but he chose not to because he wants to go home and care for his wife. Unit case manager reported that patient actually bring his wife from the assisted living facility after few weeks because she does not want to be in a placement. The she stated he was able to care for her until he came to the hospital. He has a daughter who was not able to care for him and his wife.   Past Medical History:  Past Medical History  Diagnosis Date  . Chronic kidney disease   . Hypertension   . Anemia   . Pacemaker   . Cancer     bladder -   . Hyperlipidemia   . Nephrolithiasis   . Atrial fibrillation     Past Surgical History  Procedure Laterality Date  . Tonsillectomy    . Bladder removal    . Permanent pacemaker insertion    . Revision of scar on face/head      due to wound   . Av fistula placement Right 10/01/2014    Procedure: INSERTION OF ARTERIOVENOUS (AV) GORE-TEX GRAFT RIGHT ARM;  Surgeon: Elam Dutch, MD;  Location: [facility];  Service: Vascular;  Laterality: Right;  . Insert / replace / remove pacemaker    . Cholecystectomy    . Appendectomy    . Tonsillectomy     Family History:  Family  History  Problem Relation Age of Onset  . Stroke Mother   . Hypertension Mother   . Varicose Veins Mother   . Heart disease Mother   . Heart attack Father    Social History:  History  Alcohol Use  . 0.0 oz/week  . 0 Standard drinks or equivalent per week     History  Drug Use No    History   Social History  . Marital Status: Married    Spouse Name: N/A  . Number of Children: N/A  . Years of Education: N/A   Social History Main Topics  . Smoking status: Former Smoker    Quit date: 04/19/1980  . Smokeless tobacco: Not on file  . Alcohol Use:  0.0 oz/week    0 Standard drinks or equivalent per week  . Drug Use: No  . Sexual Activity: No   Other Topics Concern  . None   Social History Narrative   Additional Social History:                          Allergies:  No Known Allergies  Labs:  Results for orders placed or performed during the hospital encounter of 11/09/14 (from the past 48 hour(s))  CBC     Status: Abnormal   Collection Time: 11/14/14  5:09 AM  Result Value Ref Range   WBC 4.5 4.0 - 10.5 K/uL   RBC 3.18 (L) 4.22 - 5.81 MIL/uL   Hemoglobin 9.7 (L) 13.0 - 17.0 g/dL   HCT 30.7 (L) 39.0 - 52.0 %   MCV 96.5 78.0 - 100.0 fL   MCH 30.5 26.0 - 34.0 pg   MCHC 31.6 30.0 - 36.0 g/dL   RDW 14.2 11.5 - 15.5 %   Platelets 97 (L) 150 - 400 K/uL    Comment: CONSISTENT WITH PREVIOUS RESULT  Basic metabolic panel     Status: Abnormal   Collection Time: 11/14/14  5:09 AM  Result Value Ref Range   Sodium 137 135 - 145 mmol/L   Potassium 3.9 3.5 - 5.1 mmol/L   Chloride 104 101 - 111 mmol/L   CO2 24 22 - 32 mmol/L   Glucose, Bld 103 (H) 65 - 99 mg/dL   BUN 33 (H) 6 - 20 mg/dL   Creatinine, Ser 3.78 (H) 0.61 - 1.24 mg/dL   Calcium 8.1 (L) 8.9 - 10.3 mg/dL   GFR calc non Af Amer 13 (L) >60 mL/min   GFR calc Af Amer 15 (L) >60 mL/min    Comment: (NOTE) The eGFR has been calculated using the CKD EPI equation. This calculation has not been validated in all clinical situations. eGFR's persistently <60 mL/min signify possible Chronic Kidney Disease.    Anion gap 9 5 - 15  CBC     Status: Abnormal   Collection Time: 11/15/14  5:07 AM  Result Value Ref Range   WBC 4.7 4.0 - 10.5 K/uL   RBC 3.01 (L) 4.22 - 5.81 MIL/uL   Hemoglobin 9.1 (L) 13.0 - 17.0 g/dL   HCT 29.0 (L) 39.0 - 52.0 %   MCV 96.3 78.0 - 100.0 fL   MCH 30.2 26.0 - 34.0 pg   MCHC 31.4 30.0 - 36.0 g/dL   RDW 14.2 11.5 - 15.5 %   Platelets 109 (L) 150 - 400 K/uL    Comment: CONSISTENT WITH PREVIOUS RESULT  Basic metabolic panel     Status:  Abnormal   Collection Time: 11/15/14  5:07 AM  Result Value Ref Range   Sodium 137 135 - 145 mmol/L   Potassium 4.1 3.5 - 5.1 mmol/L   Chloride 101 101 - 111 mmol/L   CO2 26 22 - 32 mmol/L   Glucose, Bld 101 (H) 65 - 99 mg/dL   BUN 45 (H) 6 - 20 mg/dL   Creatinine, Ser 4.89 (H) 0.61 - 1.24 mg/dL   Calcium 8.3 (L) 8.9 - 10.3 mg/dL   GFR calc non Af Amer 10 (L) >60 mL/min   GFR calc Af Amer 11 (L) >60 mL/min    Comment: (NOTE) The eGFR has been calculated using the CKD EPI equation. This calculation has not been validated in all clinical situations. eGFR's persistently <60 mL/min signify possible Chronic Kidney Disease.    Anion gap 10 5 - 15    Vitals: Blood pressure 142/58, pulse 71, temperature 98 F (36.7 C), temperature source Oral, resp. rate 16, height 5' 6"  (1.676 m), weight 73 kg (160 lb 15 oz), SpO2 98 %.  Risk to Self: Is patient at risk for suicide?: No Risk to Others:   Prior Inpatient Therapy:   Prior Outpatient Therapy:    Current Facility-Administered Medications  Medication Dose Route Frequency Provider Last Rate Last Dose  . 0.9 %  sodium chloride infusion  100 mL Intravenous PRN Mauricia Area, MD      . acetaminophen (TYLENOL) tablet 650 mg  650 mg Oral Q6H PRN Samella Parr, NP   650 mg at 11/13/14 1625   Or  . acetaminophen (TYLENOL) suppository 650 mg  650 mg Rectal Q6H PRN Samella Parr, NP      . aspirin EC tablet 81 mg  81 mg Oral Daily Samella Parr, NP   81 mg at 11/14/14 1004  . atorvastatin (LIPITOR) tablet 10 mg  10 mg Oral q1800 Samella Parr, NP   10 mg at 11/14/14 1830  . calcitRIOL (ROCALTROL) capsule 0.5 mcg  0.5 mcg Oral Daily Mauricia Area, MD   0.5 mcg at 11/14/14 1006  . cefTRIAXone (ROCEPHIN) 1 g in dextrose 5 % 50 mL IVPB - Premix  1 g Intravenous Q24H Theodis Blaze, MD   1 g at 11/14/14 1631  . Darbepoetin Alfa (ARANESP) injection 100 mcg  100 mcg Intravenous Q Sat-HD Mauricia Area, MD      . feeding supplement (NEPRO CARB  STEADY) liquid 237 mL  237 mL Oral PRN Mauricia Area, MD      . ferric gluconate (NULECIT) 125 mg in sodium chloride 0.9 % 100 mL IVPB  125 mg Intravenous Q M,W,F-HD Mauricia Area, MD   125 mg at 11/13/14 1255  . heparin injection 1,000 Units  1,000 Units Dialysis PRN Mauricia Area, MD      . lidocaine (PF) (XYLOCAINE) 1 % injection 5 mL  5 mL Intradermal PRN Mauricia Area, MD      . lidocaine-prilocaine (EMLA) cream 1 application  1 application Topical PRN Mauricia Area, MD      . metoprolol tartrate (LOPRESSOR) tablet 12.5 mg  12.5 mg Oral BID Samella Parr, NP   12.5 mg at 11/14/14 2104  . multivitamin (RENA-VIT) tablet 1 tablet  1 tablet Oral QHS Mauricia Area, MD   1 tablet at 11/14/14 2104  . ondansetron (ZOFRAN) tablet 4 mg  4 mg Oral Q6H PRN Samella Parr, NP       Or  . ondansetron [facility]) injection 4 mg  4 mg Intravenous Q6H PRN  Samella Parr, NP      . oxyCODONE-acetaminophen (PERCOCET/ROXICET) 5-325 MG per tablet 1 tablet  1 tablet Oral Q6H PRN Samella Parr, NP   1 tablet at 11/13/14 2158  . pentafluoroprop-tetrafluoroeth (GEBAUERS) aerosol 1 application  1 application Topical PRN Mauricia Area, MD      . sodium chloride 0.9 % injection 3 mL  3 mL Intravenous Q12H Samella Parr, NP   3 mL at 11/14/14 2108  . temazepam (RESTORIL) capsule 15 mg  15 mg Oral QHS PRN Samella Parr, NP   15 mg at 11/15/14 0214  . triamcinolone cream (KENALOG) 0.1 % 1 application  1 application Topical BID Samella Parr, NP   1 application at 53/79/43 2108    Musculoskeletal: Strength & Muscle Tone: within normal limits Gait & Station: normal Patient leans: Front  Psychiatric Specialty Exam: Physical Exam as per history and physical   ROS feeling better from the generalized weakness secondary to receiving hemodialysis and feel better today No Fever-chills, No Headache, No changes with Vision or hearing, reports vertigo No problems swallowing food or Liquids, No Chest  pain, Cough or Shortness of Breath, No Abdominal pain, No Nausea or Vommitting, Bowel movements are regular, No Blood in stool or Urine, No dysuria, No new skin rashes or bruises, No new joints pains-aches,  No new weakness, tingling, numbness in any extremity, No recent weight gain or loss, No polyuria, polydypsia or polyphagia,   A full 10 point Review of Systems was done, except as stated above, all other Review of Systems were negative.  Blood pressure 142/58, pulse 71, temperature 98 F (36.7 C), temperature source Oral, resp. rate 16, height 5' 6"  (1.676 m), weight 73 kg (160 lb 15 oz), SpO2 98 %.Body mass index is 25.99 kg/(m^2).  General Appearance: Casual  Eye Contact::  Good  Speech:  Clear and Coherent  Volume:  Decreased  Mood:  Anxious  Affect:  Appropriate and Congruent  Thought Process:  Coherent and Goal Directed  Orientation:  Full (Time, Place, and Person)  Thought Content:  WDL  Suicidal Thoughts:  No  Homicidal Thoughts:  No  Memory:  Immediate;   Good Recent;   Good  Judgement:  Fair  Insight:  Fair  Psychomotor Activity:  Decreased  Concentration:  Good  Recall:  Good  Fund of Knowledge:Good  Language: Good  Akathisia:  Negative  Handed:  Right  AIMS (if indicated):     Assets:  Communication Skills Desire for Improvement Financial Resources/Insurance Housing Leisure Time Resilience Social Support Talents/Skills Transportation  ADL's:  Intact  Cognition: WNL  Sleep:      Medical Decision Making: Review of Psycho-Social Stressors (1), Review or order clinical lab tests (1), Review of Last Therapy Session (1), Review or order medicine tests (1), Review of Medication Regimen & Side Effects (2) and Review of New Medication or Change in Dosage (2)  Treatment Plan Summary: Patient presented with generalized weakness secondary to chronic kidney disease which was improved since receiving hemodialysis. Patient has intact cognitions during my  evaluation. Daily contact with patient to assess and evaluate symptoms and progress in treatment and Medication management  Plan: Case discussed with Dr. Ree Kida Patient meet criteria for capacity to make his own medical decisions and living arrangements as per my evaluation today. Recommended no acute psychiatric medication management as he has no agitation or aggressive behaviors or psychosis. Patient does not meet criteria for psychiatric inpatient admission. Supportive therapy provided about ongoing  stressors.  Appreciate psychiatric consultation and we will sign off Please contact [phone] or [phone] if needs further assistance   Disposition: May discharge to home for outpatient medication management when medically cleared.  Staria Birkhead,JANARDHAHA R. 11/15/2014 10:03 AM

## 2014-11-15 NOTE — Clinical Social Work Note (Signed)
[facility] discharging home today and will be transported by ambulance. CSW provided patient with information on [facility] transportation, his dialysis information sheet, and CSW's card.  CSW signing off as patient discharging home.   Celia Friedland Givens, MSW, LCSW Licensed Clinical Social Worker [place] [phone]

## 2014-11-15 NOTE — Discharge Summary (Signed)
Physician Discharge Summary  Robert Morgan QIO:[id number] DOB: November 10, 1927 DOA: 11/09/2014  PCP: Leota Jacobsen, MD  Admit date: 11/09/2014 Discharge date: 11/15/2014  Time spent: 45 minutes  Recommendations for Outpatient Follow-up:  Patient will be discharged to home with home health services PT and OT (patient was urged to be discharged to SNF but refused).  Patient will need to follow up with primary care provider within one week of discharge.  Patient should continue dialysis as scheduled.  Patient should continue medications as prescribed.  Patient should follow a renal diet with 1268ml fluid restriction per day. Patient should not drive.  Discharge Diagnoses:  CK D stage V progress to end-stage renal disease Thrombocytopenia Anemia of chronic disease Hypokalemia Right-sided chest pain Metabolic bone disease Hypertension Atrial fibrillation on anticoagulation Urinary tract infection Deconditioning  Discharge Condition: Stable  Diet recommendation: renal diet with 128ml fluid restriction per day  [facility] Weights   11/13/14 1356 11/13/14 2108 11/14/14 2015  Weight: 70.7 kg (155 lb 13.8 oz) 70.7 kg (155 lb 13.8 oz) 73 kg (160 lb 15 oz)    History of present illness:  on 11/09/2014 by Dr. Eleonore Chiquito This is a 81 year old male patient with known history of chronic kidney disease status post placement of right upper arm AV graft on June 14, history of hypertension, anemia of chronic disease, sick sinus syndrome requiring pacemaker, bladder cancer requiring urostomy. Patient presented to [facility] after experiencing extreme weakness in the past 24 hours. He felt so weak that he lay down on the floor for several hours and then opted to seek treatment. At the outside facility his creatinine was 8.1 with a bicarbonate of 14 a potassium of 4.1. Patient reports that he does not know exactly what his baseline creatinine has been it is never been as high as 8. Because of these  findings and likelihood of need to initiate hemodialysis during this admission patient was transferred to [place] at [facility] also spoke with the nephrologist prior to patient being transported.  In discussion with the patient he again reemphasizes that he did not fall down but lay down on the floor because he felt so bad. He has not had any shortness of breath or change in his chronic lower extremity edema. He is not had any nausea vomiting or abdominal pain or cramping. Denied any increase or decrease in urostomy output recently. In review of Dr. Oneida Alar postoperative follow-up note on 7/7 is documented that the graft is ready for use if needed. Patient is currently in process of being prepared to begin first dialysis treatment today  Hospital Course:  CKD (chronic kidney disease) stage 5 now ESRD - requiring HD - Patient should continue dialysis on MWF  Thrombocytopenia  - chronic, slightly improving, 109 today - hold Heparin SQ, placed on SCD's   Anemia of chronic disease, ESRD - aranesp per nephrology team  - hb currently 9.1  Hypokalemia - Resolved, address with HD - Continue to monitor BMP with HD  Right chest pain - likely 2/2 MSK due to costochondritis. Resolved - provide analgesia as needed  Metabolic Bone Disease - On calcitiol and phoslo at home.   HTN - reasonable inpatient control, continue metoprolol  AFib not on AC, CHADS2 = 3-4  - lopressor 12.5mg  BID - no AC due to high risk fall   ? UTI - Urine culture shows multiple spaces present - unknown if related to urostomy - Patient was placed on ceftriaxone and completed  course  Deconditioning - PT and OT recommended SNF, however, patient continues to refuse. - Psychiatry was consulted to assess patient's competency. Patient is competent to make his own decisions. - Patient understands that he take some responsibility of being discharged home versus a skilled nursing  facility for rehabilitation. - Suspect patient may be readmitted in the near future due to inability to care for himself.  Procedures  HD  Consults  Nephrology Psychiatry  Discharge Exam: Filed Vitals:   11/15/14 1000  BP: 154/66  Pulse: 70  Temp: 97.4 F (36.3 C)  Resp: 18   Exam  General: Well developed, well nourished, no distress  HEENT: NCAT, mucous membranes moist.   Cardiovascular: S1 S2 auscultated, 2/6 SEM, RRR  Respiratory: Clear to auscultation  Abdomen: Soft, nontender, nondistended, + bowel sounds  Extremities: warm dry without cyanosis clubbing or edema  Neuro: AAOx3, nonfocal  Psych: Appropriate mood and affect  Discharge Instructions      Discharge Instructions    Discharge instructions    Complete by:  As directed   Patient will be discharged to home with home health services PT and OT.  Patient will need to follow up with primary care provider within one week of discharge.  Patient should continue dialysis as scheduled.  Patient should continue medications as prescribed.  Patient should follow a renal diet with 128ml fluid restriction per day. Patient should not drive.            Medication List    STOP taking these medications        amLODipine 10 MG tablet  Commonly known as:  NORVASC     furosemide 40 MG tablet  Commonly known as:  LASIX     metolazone 5 MG tablet  Commonly known as:  ZAROXOLYN     sodium bicarbonate 650 MG tablet      TAKE these medications        aspirin EC 81 MG tablet  Take 81 mg by mouth daily.     atorvastatin 10 MG tablet  Commonly known as:  LIPITOR  Take 10 mg by mouth daily.     calcitRIOL 0.25 MCG capsule  Commonly known as:  ROCALTROL  Take 0.25 mcg by mouth daily.     darbepoetin 60 MCG/0.3ML Soln injection  Commonly known as:  ARANESP  Inject 60 mcg into the skin every 7 (seven) days.     feeding supplement (NEPRO CARB STEADY) Liqd  Take 237 mLs by mouth as needed (missed meal  during dialysis.).     feeding supplement (NEPRO CARB STEADY) Liqd  Take 237 mLs by mouth as needed (missed meal during dialysis.).     metoprolol tartrate 25 MG tablet  Commonly known as:  LOPRESSOR  Take 25 mg by mouth 2 (two) times daily.     multivitamin Tabs tablet  Take 1 tablet by mouth at bedtime.     oxyCODONE-acetaminophen 5-325 MG per tablet  Commonly known as:  ROXICET  Take 1 tablet by mouth every 6 (six) hours as needed for severe pain.     temazepam 15 MG capsule  Commonly known as:  RESTORIL  Take 15 mg by mouth at bedtime as needed for sleep.     triamcinolone cream 0.1 %  Commonly known as:  KENALOG  Apply 1 application topically 2 (two) times daily.       No Known Allergies Follow-up Information    Follow up with Leota Jacobsen, MD. Schedule an appointment  as soon as possible for a visit in 1 week.   Specialty:  Family Medicine   Why:  Hospital follow up   Contact information:   [place] [address] [phone]        The results of significant diagnostics from this hospitalization (including imaging, microbiology, ancillary and laboratory) are listed below for reference.    Significant Diagnostic Studies: No results found.  Microbiology: Recent Results (from the past 240 hour(s))  Urine culture     Status: None   Collection Time: 11/09/14  8:54 PM  Result Value Ref Range Status   Specimen Description URINE, CLEAN CATCH  Final   Special Requests NONE  Final   Culture MULTIPLE SPECIES PRESENT, SUGGEST RECOLLECTION  Final   Report Status 11/12/2014 FINAL  Final     Labs: Basic Metabolic Panel:  Recent Labs Lab 11/09/14 1648 11/11/14 0820 11/12/14 0528 11/13/14 0515 11/14/14 0509 11/15/14 0507  NA 141 136 135 140 137 137  K 3.5 3.0* 3.2* 3.8 3.9 4.1  CL 109 103 102 105 104 101  CO2 18* 23 24 24 24 26   GLUCOSE 112* 109* 98 102* 103* 101*  BUN 112* 77* 47* 65* 33* 45*  CREATININE 7.67* 6.20* 4.42* 5.58* 3.78* 4.89*    CALCIUM 7.8* 7.2* 7.3* 7.8* 8.1* 8.3*  PHOS 7.6* 6.2* 5.1* 5.9*  --   --    Liver Function Tests:  Recent Labs Lab 11/09/14 1648 11/11/14 0820 11/12/14 0528 11/13/14 0515  ALBUMIN 3.0* 2.8* 2.6* 2.8*   No results for input(s): LIPASE, AMYLASE in the last 168 hours. No results for input(s): AMMONIA in the last 168 hours. CBC:  Recent Labs Lab 11/09/14 1650 11/11/14 0820 11/12/14 0528 11/13/14 0515 11/14/14 0509 11/15/14 0507  WBC 5.2 4.0 4.0 4.3 4.5 4.7  NEUTROABS 3.9  --   --   --   --   --   HGB 9.2* 9.0* 9.1* 9.8* 9.7* 9.1*  HCT 28.6* 27.9* 28.5* 31.1* 30.7* 29.0*  MCV 92.0 93.6 95.0 95.7 96.5 96.3  PLT 101* 88* 81* 99* 97* 109*   Cardiac Enzymes: No results for input(s): CKTOTAL, CKMB, CKMBINDEX, TROPONINI in the last 168 hours. BNP: BNP (last 3 results) No results for input(s): BNP in the last 8760 hours.  ProBNP (last 3 results) No results for input(s): PROBNP in the last 8760 hours.  CBG: No results for input(s): GLUCAP in the last 168 hours.     SignedCristal Ford  [facility] 11/15/2014, 11:57 AM

## 2014-11-15 NOTE — Progress Notes (Signed)
Hemodialysis-Pt tolerated well, unable to meet fluid goal d/t bp. Pt is to be at [facility] on Monday at 11am.

## 2014-11-15 NOTE — Progress Notes (Signed)
Subjective: NAD, very interactive this morning, states he is in [place] torture chamber jokingly. Denies SOB, CP or rib pain.   Objective: Vital signs in last 24 hours: Temp:  [97.4 F (36.3 C)-98.3 F (36.8 C)] 97.4 F (36.3 C) (07/29 1000) Pulse Rate:  [70-83] 70 (07/29 1000) Resp:  [16-18] 18 (07/29 1000) BP: (122-154)/(58-91) 154/66 mmHg (07/29 1000) SpO2:  [97 %-99 %] 99 % (07/29 1000) Weight:  [160 lb 15 oz (73 kg)] 160 lb 15 oz (73 kg) (07/28 2015) Weight change: 2 lb 13.9 oz (1.3 kg)  Intake/Output from previous day: 07/28 0701 - 07/29 0700 In: 700 [P.O.:600; IV Piggyback:100] Out: 275 [Urine:275] Intake/Output this shift: Total I/O In: 240 [P.O.:240] Out: 375 [Urine:375]  Physical Exam  Constitutional: He appears well-developed and well-nourished. No distress.  HENT:  Head: Normocephalic and atraumatic.  Mouth/Throat: Oropharynx is clear and moist.  Eyes: Conjunctivae are normal.  Cardiovascular: Normal rate and regular rhythm.   No murmur heard. Pulmonary/Chest: Effort normal and breath sounds normal. No respiratory distress. He has no wheezes. He has no rales. He exhibits no tenderness.  Abdominal: Soft. Bowel sounds are normal. He exhibits no distension. There is no tenderness.  Skin: Skin is warm and dry.    Lab Results:  Recent Labs  11/14/14 0509 11/15/14 0507  WBC 4.5 4.7  HGB 9.7* 9.1*  HCT 30.7* 29.0*  PLT 97* 109*   BMET:   Recent Labs  11/14/14 0509 11/15/14 0507  NA 137 137  K 3.9 4.1  CL 104 101  CO2 24 26  GLUCOSE 103* 101*  BUN 33* 45*  CREATININE 3.78* 4.89*  CALCIUM 8.1* 8.3*     Studies/Results: No results found.  Scheduled: . aspirin EC  81 mg Oral Daily  . atorvastatin  10 mg Oral q1800  . calcitRIOL  0.5 mcg Oral Daily  . cefTRIAXone (ROCEPHIN)  IV  1 g Intravenous Q24H  . darbepoetin (ARANESP) injection - DIALYSIS  100 mcg Intravenous Q Sat-HD  . ferric gluconate (FERRLECIT/NULECIT) IV  125 mg Intravenous Q  M,W,F-HD  . metoprolol tartrate  12.5 mg Oral BID  . multivitamin  1 tablet Oral QHS  . sodium chloride  3 mL Intravenous Q12H  . triamcinolone cream  1 application Topical BID    Assessment/Plan: 1. ESRD newly started on HD this admission-  - HD today - [facility]IPPed, will have HD MWF in [place] - being discharged home today after HD.  2. Right chest pain- likely 2/2 MSK due to costochondritis. 3. Thromboycytopenia-chornic 4. Anemia of chronic kidney disease- cont aranesp  5. Metabolic Bone Disease:On calcitiol and phoslo at home.  6. HTN- per primary 7. AFib not on [facility]- lopressor 12.5mg  BID     LOS: 6 days   [name] 11/15/2014,12:14 PM

## 2014-11-15 NOTE — Care Management Note (Signed)
Case Management Note  Patient Details  Name: RHYDIAN BALDI MRN: [id number] Date of Birth: 17-Sep-1927  Subjective/Objective:             CM following for progression and d/c planning.       Action/Plan: Multiple meetings with pt re d/c planning, pt refusing SNF, HH with [facility] arranged and walker ordered for delivery to home from [place]. Daughter Octavio Graves notified of pt decision and CM concerns for pt safety.   Expected Discharge Date:  11/15/2014            Expected Discharge Plan:   Home with [facility] services  In-House Referral:  Clinical Social Work  Discharge planning Services  CM Consult  Post Acute Care Choice:    Choice offered to:     DME Arranged:   Gilford Rile, rolling DME Agency:   [facility]  HH Arranged:   HHRN, PT, OT, social worker and aide. [place] Agency:   [facility]  Status of Service:   Complete  Medicare Important Message Given:  Yes-second notification given Date Medicare IM Given:    Medicare IM give by:    Date Additional Medicare IM Given:    Additional Medicare Important Message give by:     If discussed at [place] of Stay Meetings, dates discussed:    Additional Comments:  Adron Bene, RN 11/15/2014, 4:56 PM

## 2015-05-22 ENCOUNTER — Ambulatory Visit (HOSPITAL_COMMUNITY)
Admission: RE | Admit: 2015-05-22 | Discharge: 2015-05-22 | Disposition: A | Discharge status: Home / Self Care | Payer: Medicare (Managed Care) | Source: Ambulatory Visit | Attending: Nephrology | Admitting: Nephrology

## 2015-05-22 ENCOUNTER — Encounter (HOSPITAL_COMMUNITY): Payer: Self-pay

## 2015-05-22 ENCOUNTER — Other Ambulatory Visit (HOSPITAL_COMMUNITY): Payer: Self-pay | Admitting: Nephrology

## 2015-05-22 ENCOUNTER — Other Ambulatory Visit: Payer: Self-pay | Admitting: Radiology

## 2015-05-22 DIAGNOSIS — Z7982 Long term (current) use of aspirin: Secondary | ICD-10-CM | POA: Insufficient documentation

## 2015-05-22 DIAGNOSIS — N186 End stage renal disease: Secondary | ICD-10-CM | POA: Diagnosis not present

## 2015-05-22 DIAGNOSIS — Z87891 Personal history of nicotine dependence: Secondary | ICD-10-CM | POA: Diagnosis not present

## 2015-05-22 DIAGNOSIS — Z87442 Personal history of urinary calculi: Secondary | ICD-10-CM | POA: Diagnosis not present

## 2015-05-22 DIAGNOSIS — Z8249 Family history of ischemic heart disease and other diseases of the circulatory system: Secondary | ICD-10-CM | POA: Diagnosis not present

## 2015-05-22 DIAGNOSIS — D649 Anemia, unspecified: Secondary | ICD-10-CM | POA: Insufficient documentation

## 2015-05-22 DIAGNOSIS — T82868A Thrombosis of vascular prosthetic devices, implants and grafts, initial encounter: Secondary | ICD-10-CM | POA: Insufficient documentation

## 2015-05-22 DIAGNOSIS — I120 Hypertensive chronic kidney disease with stage 5 chronic kidney disease or end stage renal disease: Secondary | ICD-10-CM | POA: Diagnosis not present

## 2015-05-22 DIAGNOSIS — Z950 Presence of cardiac pacemaker: Secondary | ICD-10-CM | POA: Insufficient documentation

## 2015-05-22 DIAGNOSIS — Z992 Dependence on renal dialysis: Secondary | ICD-10-CM | POA: Insufficient documentation

## 2015-05-22 DIAGNOSIS — Z8551 Personal history of malignant neoplasm of bladder: Secondary | ICD-10-CM | POA: Diagnosis not present

## 2015-05-22 DIAGNOSIS — Y832 Surgical operation with anastomosis, bypass or graft as the cause of abnormal reaction of the patient, or of later complication, without mention of misadventure at the time of the procedure: Secondary | ICD-10-CM | POA: Diagnosis not present

## 2015-05-22 DIAGNOSIS — E785 Hyperlipidemia, unspecified: Secondary | ICD-10-CM | POA: Diagnosis not present

## 2015-05-22 DIAGNOSIS — I4891 Unspecified atrial fibrillation: Secondary | ICD-10-CM | POA: Insufficient documentation

## 2015-05-22 LAB — CBC
HEMATOCRIT: 33.4 % — AB (ref 39.0–52.0)
HEMOGLOBIN: 11 g/dL — AB (ref 13.0–17.0)
MCH: 32.9 pg (ref 26.0–34.0)
MCHC: 32.9 g/dL (ref 30.0–36.0)
MCV: 100 fL (ref 78.0–100.0)
Platelets: 115 10*3/uL — ABNORMAL LOW (ref 150–400)
RBC: 3.34 MIL/uL — ABNORMAL LOW (ref 4.22–5.81)
RDW: 16.2 % — AB (ref 11.5–15.5)
WBC: 4.5 10*3/uL (ref 4.0–10.5)

## 2015-05-22 LAB — BASIC METABOLIC PANEL
ANION GAP: 13 (ref 5–15)
BUN: 42 mg/dL — ABNORMAL HIGH (ref 6–20)
CO2: 31 mmol/L (ref 22–32)
Calcium: 9.1 mg/dL (ref 8.9–10.3)
Chloride: 99 mmol/L — ABNORMAL LOW (ref 101–111)
Creatinine, Ser: 8.11 mg/dL — ABNORMAL HIGH (ref 0.61–1.24)
GFR calc Af Amer: 6 mL/min — ABNORMAL LOW (ref >60)
GFR, EST NON AFRICAN AMERICAN: 5 mL/min — AB (ref >60)
GLUCOSE: 121 mg/dL — AB (ref 65–99)
POTASSIUM: 3.8 mmol/L (ref 3.5–5.1)
SODIUM: 143 mmol/L (ref 135–145)

## 2015-05-22 LAB — PROTIME-INR
INR: 1 (ref 0.00–1.49)
PROTHROMBIN TIME: 13.4 s (ref 11.6–15.2)

## 2015-05-22 LAB — APTT: aPTT: 31 seconds (ref 24–37)

## 2015-05-22 MED ORDER — ALTEPLASE 100 MG IV SOLR
2.0000 mg | Freq: Once | INTRAVENOUS | Status: DC
  Filled 2015-05-22: qty 2

## 2015-05-22 MED ORDER — SODIUM CHLORIDE 0.9 % IV SOLN: Freq: Once | INTRAVENOUS | Status: DC

## 2015-05-22 MED ORDER — ACETAMINOPHEN 325 MG PO TABS
ORAL_TABLET | ORAL | Status: AC
  Filled 2015-05-22: qty 2

## 2015-05-22 MED ORDER — HEPARIN SODIUM (PORCINE) 1000 UNIT/ML IJ SOLN
INTRAMUSCULAR | Status: AC
  Filled 2015-05-22: qty 1

## 2015-05-22 MED ORDER — HEPARIN SODIUM (PORCINE) 1000 UNIT/ML IJ SOLN
INTRAMUSCULAR | Status: AC | PRN
  Administered 2015-05-22: 3000 [IU] via INTRAVENOUS

## 2015-05-22 MED ORDER — LIDOCAINE HCL 1 % IJ SOLN
INTRAMUSCULAR | Status: AC
  Filled 2015-05-22: qty 20

## 2015-05-22 MED ORDER — FENTANYL CITRATE (PF) 100 MCG/2ML IJ SOLN
INTRAMUSCULAR | Status: AC
  Filled 2015-05-22: qty 4

## 2015-05-22 MED ORDER — MIDAZOLAM HCL 2 MG/2ML IJ SOLN
INTRAMUSCULAR | Status: AC
  Filled 2015-05-22: qty 4

## 2015-05-22 MED ORDER — FENTANYL CITRATE (PF) 100 MCG/2ML IJ SOLN
INTRAMUSCULAR | Status: AC | PRN
  Administered 2015-05-22 (×3): 25 ug via INTRAVENOUS

## 2015-05-22 MED ORDER — MIDAZOLAM HCL 2 MG/2ML IJ SOLN
INTRAMUSCULAR | Status: AC | PRN
  Administered 2015-05-22 (×2): 1 mg via INTRAVENOUS

## 2015-05-22 MED ORDER — IOHEXOL 300 MG/ML  SOLN: 150.0000 mL | Freq: Once | INTRAMUSCULAR | Status: DC | PRN

## 2015-05-22 MED ORDER — ACETAMINOPHEN 325 MG PO TABS
650.0000 mg | ORAL_TABLET | ORAL | Status: AC
  Administered 2015-05-22: 650 mg via ORAL
  Filled 2015-05-22: qty 2

## 2015-05-22 NOTE — Sedation Documentation (Signed)
Patient is resting comfortably. 

## 2015-05-22 NOTE — Sedation Documentation (Signed)
Pt grimacing and moaning

## 2015-05-22 NOTE — H&P (Signed)
Chief Complaint: Patient was seen in consultation today for Rt dialysis fistula thrombolysis at the request of Lin,James W  Referring Physician(s): Lin,James W  History of Present Illness: Robert Morgan is a 82 y.o. male   ESRD Clotted Right upper arm dialysis fistula Last use Mon 05/19/15--- no issues Wed 2/1---unable to use Now scheduled for thrombolysis with possible angioplasty/stent placement; possible dialysis catheter placement  Hx bladder Ca---urostomy   Past Medical History  Diagnosis Date  . Chronic kidney disease   . Hypertension   . Anemia   . Pacemaker   . Cancer ([place])     bladder -   . Hyperlipidemia   . Nephrolithiasis   . Atrial fibrillation [facility])     Past Surgical History  Procedure Laterality Date  . Tonsillectomy    . Bladder removal    . Permanent pacemaker insertion    . Revision of scar on face/head      due to wound   . Av fistula placement Right 10/01/2014    Procedure: INSERTION OF ARTERIOVENOUS (AV) GORE-TEX GRAFT RIGHT ARM;  Surgeon: Elam Dutch, MD;  Location: [place];  Service: Vascular;  Laterality: Right;  . Insert / replace / remove pacemaker    . Cholecystectomy    . Appendectomy    . Tonsillectomy      Allergies: Review of patient's allergies indicates no known allergies.  Medications: Prior to Admission medications   Medication Sig Start Date End Date Taking? Authorizing Provider  aspirin EC 81 MG tablet Take 81 mg by mouth daily.    Historical Provider, MD  atorvastatin (LIPITOR) 10 MG tablet Take 10 mg by mouth daily.    Historical Provider, MD  calcitRIOL (ROCALTROL) 0.25 MCG capsule Take 0.25 mcg by mouth daily.    Historical Provider, MD  darbepoetin (ARANESP) 60 MCG/0.3ML SOLN injection Inject 60 mcg into the skin every 7 (seven) days.    Historical Provider, MD  metoprolol tartrate (LOPRESSOR) 25 MG tablet Take 25 mg by mouth 2 (two) times daily.    Historical Provider, MD  multivitamin (RENA-VIT) TABS tablet  Take 1 tablet by mouth at bedtime. 11/13/14   Maryann Mikhail, DO  Nutritional Supplements (FEEDING SUPPLEMENT, NEPRO CARB STEADY,) LIQD Take 237 mLs by mouth as needed (missed meal during dialysis.). 11/13/14   Maryann Mikhail, DO  Nutritional Supplements (FEEDING SUPPLEMENT, NEPRO CARB STEADY,) LIQD Take 237 mLs by mouth as needed (missed meal during dialysis.). 11/13/14   Maryann Mikhail, DO  omeprazole (PRILOSEC) 40 MG capsule Take 40 mg by mouth daily.    Historical Provider, MD  oxyCODONE-acetaminophen (ROXICET) 5-325 MG per tablet Take 1 tablet by mouth every 6 (six) hours as needed for severe pain. 11/13/14   Maryann Mikhail, DO  sevelamer carbonate (RENVELA) 800 MG tablet Take 1,600 mg by mouth 3 (three) times daily with meals.    Historical Provider, MD  temazepam (RESTORIL) 15 MG capsule Take 15 mg by mouth at bedtime as needed for sleep.    Historical Provider, MD  triamcinolone cream (KENALOG) 0.1 % Apply 1 application topically 2 (two) times daily.    Historical Provider, MD     Family History  Problem Relation Age of Onset  . Stroke Mother   . Hypertension Mother   . Varicose Veins Mother   . Heart disease Mother   . Heart attack Father     Social History   Social History  . Marital Status: Married    Spouse Name: N/A  .  Number of Children: N/A  . Years of Education: N/A   Social History Main Topics  . Smoking status: Former Smoker    Quit date: 04/19/1980  . Smokeless tobacco: None  . Alcohol Use: 0.0 oz/week    0 Standard drinks or equivalent per week  . Drug Use: No  . Sexual Activity: No   Other Topics Concern  . None   Social History Narrative     Review of Systems: A 12 point ROS discussed and pertinent positives are indicated in the HPI above.  All other systems are negative.  Review of Systems  Constitutional: Negative for fever, activity change and fatigue.  Respiratory: Negative for shortness of breath.   Gastrointestinal: Negative for abdominal  pain.  Neurological: Positive for weakness.  Psychiatric/Behavioral: Negative for behavioral problems and confusion.    Vital Signs: BP 155/78 mmHg  Pulse 72  Temp(Src) 97.6 F (36.4 C) (Oral)  Resp 16  Ht 5\' 4"  (1.626 m)  Wt 165 lb (74.844 kg)  BMI 28.31 kg/m2  SpO2 98%  Physical Exam  Constitutional: He is oriented to person, place, and time.  Cardiovascular: Normal rate and regular rhythm.   Pulmonary/Chest: Effort normal and breath sounds normal. He has no wheezes.  Abdominal: Soft. Bowel sounds are normal. He exhibits no distension.  Musculoskeletal: Normal range of motion.  Rt arm fistula no thrill +pulse -distally  Neurological: He is alert and oriented to person, place, and time.  Skin: Skin is warm and dry.  Psychiatric: He has a normal mood and affect. His behavior is normal. Judgment and thought content normal.  Nursing note and vitals reviewed.   Mallampati Score:  MD Evaluation Airway: WNL Heart: WNL Abdomen: WNL Chest/ Lungs: WNL ASA  Classification: 3 Mallampati/Airway Score: Two  Imaging: No results found.  Labs:  CBC:  Recent Labs  11/13/14 0515 11/14/14 0509 11/15/14 0507 05/22/15 1108  WBC 4.3 4.5 4.7 4.5  HGB 9.8* 9.7* 9.1* 11.0*  HCT 31.1* 30.7* 29.0* 33.4*  PLT 99* 97* 109* 115*    COAGS:  Recent Labs  05/22/15 1108  INR 1.00  APTT 31    BMP:  Recent Labs  11/13/14 0515 11/14/14 0509 11/15/14 0507 05/22/15 1108  NA 140 137 137 143  K 3.8 3.9 4.1 3.8  CL 105 104 101 99*  CO2 24 24 26 31   GLUCOSE 102* 103* 101* 121*  BUN 65* 33* 45* 42*  CALCIUM 7.8* 8.1* 8.3* 9.1  CREATININE 5.58* 3.78* 4.89* 8.11*  GFRNONAA 8* 13* 10* 5*  GFRAA 10* 15* 11* 6*    LIVER FUNCTION TESTS:  Recent Labs  11/09/14 1648 11/11/14 0820 11/12/14 0528 11/13/14 0515  ALBUMIN 3.0* 2.8* 2.6* 2.8*    TUMOR MARKERS: No results for input(s): AFPTM, CEA, CA199, CHROMGRNA in the last 8760 hours.  Assessment and Plan:  Rt upper  arm dialysis fistula clotted Scheduled now for thrombolysis with possible pta/stent Possible tunneled dialysis catheter placement if needed Risks and Benefits discussed with the patient including, but not limited to bleeding, infection, vascular injury, pulmonary embolism, need for tunneled HD catheter placement or even death. All of the patient's questions were answered, patient is agreeable to proceed. Consent signed and in chart.   Thank you for this interesting consult.  I greatly enjoyed meeting MIKI HARTNER and look forward to participating in their care.  A copy of this report was sent to the requesting provider on this date.  Electronically Signed: Lavonia Drafts 05/22/2015, 12:53 PM  I spent a total of  30 Minutes   in face to face in clinical consultation, greater than 50% of which was counseling/coordinating care for rt arm dialysis fistula declot

## 2015-05-22 NOTE — Sedation Documentation (Signed)
Pt will recover 1 hr SStay then d/c home

## 2015-05-22 NOTE — Sedation Documentation (Addendum)
Patient says he is hurting

## 2015-05-22 NOTE — Discharge Instructions (Signed)
Fistulogram, Care After °Refer to this sheet in the next few weeks. These instructions provide you with information on caring for yourself after your procedure. Your health care provider may also give you more specific instructions. Your treatment has been planned according to current medical practices, but problems sometimes occur. Call your health care provider if you have any problems or questions after your procedure. °WHAT TO EXPECT AFTER THE PROCEDURE °After your procedure, it is typical to have the following: °· A small amount of discomfort in the area where the catheters were placed. °· A small amount of bruising around the fistula. °· Sleepiness and fatigue. °HOME CARE INSTRUCTIONS °· Rest at home for the day following your procedure. °· Do not drive or operate heavy machinery while taking pain medicine. °· Take medicines only as directed by your health care provider. °· Do not take baths, swim, or use a hot tub until your health care provider approves. You may shower 24 hours after the procedure or as directed by your health care provider. °· There are many different ways to close and cover an incision, including stitches, skin glue, and adhesive strips. Follow your health care provider's instructions on: °¨ Incision care. °¨ Bandage (dressing) changes and removal. °¨ Incision closure removal. °· Monitor your dialysis fistula carefully. °SEEK MEDICAL CARE IF: °· You have drainage, redness, swelling, or pain at your catheter site. °· You have a fever. °· You have chills. °SEEK IMMEDIATE MEDICAL CARE IF: °· You feel weak. °· You have trouble balancing. °· You have trouble moving your arms or legs. °· You have problems with your speech or vision. °· You can no longer feel a vibration or buzz when you put your fingers over your dialysis fistula. °· The limb that was used for the procedure: °¨ Swells. °¨ Is painful. °¨ Is cold. °¨ Is discolored, such as blue or pale white. °  °This information is not intended  to replace advice given to you by your health care provider. Make sure you discuss any questions you have with your health care provider. °  °Document Released: 08/20/2013 Document Reviewed: 08/20/2013 °Elsevier Interactive Patient Education ©2016 [employer] Inc[employer] ° °

## 2015-05-22 NOTE — Procedures (Signed)
Interventional Radiology Procedure Note  Procedure:  Right arm AVGG declot  Complications:  None  Estimated Blood Loss:  < 25 mL  Venous anastamotic stenosis treated with 7 mm balloon angioplasty with good result. Graft widely patent after thrombectomy.  Venetia Night. Kathlene Cote, M.D Pager:  [phone]

## 2015-07-23 ENCOUNTER — Other Ambulatory Visit: Payer: Self-pay | Admitting: Radiology

## 2015-07-23 ENCOUNTER — Other Ambulatory Visit (HOSPITAL_COMMUNITY): Payer: Self-pay | Admitting: Nephrology

## 2015-07-23 DIAGNOSIS — Z992 Dependence on renal dialysis: Principal | ICD-10-CM

## 2015-07-23 DIAGNOSIS — N186 End stage renal disease: Secondary | ICD-10-CM

## 2015-07-24 ENCOUNTER — Encounter (HOSPITAL_COMMUNITY): Payer: Self-pay

## 2015-07-24 ENCOUNTER — Ambulatory Visit (HOSPITAL_COMMUNITY)
Admission: RE | Admit: 2015-07-24 | Discharge: 2015-07-24 | Disposition: A | Discharge status: Home / Self Care | Payer: Medicare (Managed Care) | Source: Ambulatory Visit | Attending: Nephrology | Admitting: Nephrology

## 2015-07-24 ENCOUNTER — Other Ambulatory Visit (HOSPITAL_COMMUNITY): Payer: Self-pay | Admitting: Nephrology

## 2015-07-24 DIAGNOSIS — Z950 Presence of cardiac pacemaker: Secondary | ICD-10-CM | POA: Diagnosis not present

## 2015-07-24 DIAGNOSIS — Z992 Dependence on renal dialysis: Secondary | ICD-10-CM | POA: Insufficient documentation

## 2015-07-24 DIAGNOSIS — Z87891 Personal history of nicotine dependence: Secondary | ICD-10-CM | POA: Diagnosis not present

## 2015-07-24 DIAGNOSIS — Z9049 Acquired absence of other specified parts of digestive tract: Secondary | ICD-10-CM | POA: Insufficient documentation

## 2015-07-24 DIAGNOSIS — I4891 Unspecified atrial fibrillation: Secondary | ICD-10-CM | POA: Insufficient documentation

## 2015-07-24 DIAGNOSIS — I120 Hypertensive chronic kidney disease with stage 5 chronic kidney disease or end stage renal disease: Secondary | ICD-10-CM | POA: Insufficient documentation

## 2015-07-24 DIAGNOSIS — Z7982 Long term (current) use of aspirin: Secondary | ICD-10-CM | POA: Diagnosis not present

## 2015-07-24 DIAGNOSIS — E785 Hyperlipidemia, unspecified: Secondary | ICD-10-CM | POA: Diagnosis not present

## 2015-07-24 DIAGNOSIS — T82858A Stenosis of vascular prosthetic devices, implants and grafts, initial encounter: Secondary | ICD-10-CM | POA: Diagnosis not present

## 2015-07-24 DIAGNOSIS — Z79899 Other long term (current) drug therapy: Secondary | ICD-10-CM | POA: Diagnosis not present

## 2015-07-24 DIAGNOSIS — N186 End stage renal disease: Secondary | ICD-10-CM | POA: Insufficient documentation

## 2015-07-24 DIAGNOSIS — Z87442 Personal history of urinary calculi: Secondary | ICD-10-CM | POA: Insufficient documentation

## 2015-07-24 LAB — BASIC METABOLIC PANEL
Anion gap: 16 — ABNORMAL HIGH (ref 5–15)
BUN: 57 mg/dL — ABNORMAL HIGH (ref 6–20)
CHLORIDE: 97 mmol/L — AB (ref 101–111)
CO2: 29 mmol/L (ref 22–32)
CREATININE: 7.45 mg/dL — AB (ref 0.61–1.24)
Calcium: 8.7 mg/dL — ABNORMAL LOW (ref 8.9–10.3)
GFR, EST AFRICAN AMERICAN: 7 mL/min — AB (ref >60)
GFR, EST NON AFRICAN AMERICAN: 6 mL/min — AB (ref >60)
Glucose, Bld: 128 mg/dL — ABNORMAL HIGH (ref 65–99)
POTASSIUM: 4.8 mmol/L (ref 3.5–5.1)
SODIUM: 142 mmol/L (ref 135–145)

## 2015-07-24 MED ORDER — HEPARIN SODIUM (PORCINE) 1000 UNIT/ML IJ SOLN
INTRAMUSCULAR | Status: AC
  Filled 2015-07-24: qty 1

## 2015-07-24 MED ORDER — FENTANYL CITRATE (PF) 100 MCG/2ML IJ SOLN
INTRAMUSCULAR | Status: AC | PRN
  Administered 2015-07-24: 25 ug via INTRAVENOUS

## 2015-07-24 MED ORDER — IOPAMIDOL (ISOVUE-300) INJECTION 61%
INTRAVENOUS | Status: AC
  Filled 2015-07-24: qty 100

## 2015-07-24 MED ORDER — MIDAZOLAM HCL 2 MG/2ML IJ SOLN
INTRAMUSCULAR | Status: AC | PRN
  Administered 2015-07-24: 1 mg via INTRAVENOUS

## 2015-07-24 MED ORDER — MIDAZOLAM HCL 2 MG/2ML IJ SOLN
INTRAMUSCULAR | Status: AC
  Filled 2015-07-24: qty 2

## 2015-07-24 MED ORDER — HEPARIN SODIUM (PORCINE) 1000 UNIT/ML IJ SOLN
INTRAMUSCULAR | Status: AC | PRN
  Administered 2015-07-24: 3000 [IU] via INTRAVENOUS

## 2015-07-24 MED ORDER — ALTEPLASE 100 MG IV SOLR
2.0000 mg | Freq: Once | INTRAVENOUS | Status: AC
  Administered 2015-07-24: 2 mg
  Filled 2015-07-24: qty 2

## 2015-07-24 MED ORDER — IOPAMIDOL (ISOVUE-300) INJECTION 61%
100.0000 mL | Freq: Once | INTRAVENOUS | Status: AC | PRN
Start: 2015-07-24 — End: 2015-07-24
  Administered 2015-07-24: 50 mL via INTRAVENOUS

## 2015-07-24 MED ORDER — LIDOCAINE HCL 1 % IJ SOLN
INTRAMUSCULAR | Status: AC
  Filled 2015-07-24: qty 20

## 2015-07-24 MED ORDER — FENTANYL CITRATE (PF) 100 MCG/2ML IJ SOLN
INTRAMUSCULAR | Status: AC
  Filled 2015-07-24: qty 2

## 2015-07-24 MED ORDER — SODIUM CHLORIDE 0.9 % IV SOLN
INTRAVENOUS | Status: AC | PRN
  Administered 2015-07-24 (×2): 10 mL/h via INTRAVENOUS

## 2015-07-24 NOTE — Discharge Instructions (Signed)
Fistulogram, Care After °Refer to this sheet in the next few weeks. These instructions provide you with information on caring for yourself after your procedure. Your health care provider may also give you more specific instructions. Your treatment has been planned according to current medical practices, but problems sometimes occur. Call your health care provider if you have any problems or questions after your procedure. °WHAT TO EXPECT AFTER THE PROCEDURE °After your procedure, it is typical to have the following: °· A small amount of discomfort in the area where the catheters were placed. °· A small amount of bruising around the fistula. °· Sleepiness and fatigue. °HOME CARE INSTRUCTIONS °· Rest at home for the day following your procedure. °· Do not drive or operate heavy machinery while taking pain medicine. °· Take medicines only as directed by your health care provider. °· Do not take baths, swim, or use a hot tub until your health care provider approves. You may shower 24 hours after the procedure or as directed by your health care provider. °· There are many different ways to close and cover an incision, including stitches, skin glue, and adhesive strips. Follow your health care provider's instructions on: °¨ Incision care. °¨ Bandage (dressing) changes and removal. °¨ Incision closure removal. °· Monitor your dialysis fistula carefully. °SEEK MEDICAL CARE IF: °· You have drainage, redness, swelling, or pain at your catheter site. °· You have a fever. °· You have chills. °SEEK IMMEDIATE MEDICAL CARE IF: °· You feel weak. °· You have trouble balancing. °· You have trouble moving your arms or legs. °· You have problems with your speech or vision. °· You can no longer feel a vibration or buzz when you put your fingers over your dialysis fistula. °· The limb that was used for the procedure: °¨ Swells. °¨ Is painful. °¨ Is cold. °¨ Is discolored, such as blue or pale white. °  °This information is not intended  to replace advice given to you by your health care provider. Make sure you discuss any questions you have with your health care provider. °  °Document Released: 08/20/2013 Document Reviewed: 08/20/2013 °Elsevier Interactive Patient Education ©2016 [employer] Inc[employer] ° °

## 2015-07-24 NOTE — Procedures (Signed)
RUE AVG declot PTA venous 7 mm No comp/EBL

## 2015-07-24 NOTE — H&P (Signed)
Chief Complaint: clotted right HD graft  Referring Physician: Edrick Oh  Supervising Physician: Marybelle Killings  HPI: Robert Morgan is an 82 y.o. male who has ESRD on HD with a right end to side axillary vein graft.  This has become clotted and will not function for HD.  He has had this declotted once in the past by [place] as well.  He presents today for this procedure.  He has not eaten today.  Past Medical History:  Past Medical History  Diagnosis Date  . Chronic kidney disease   . Hypertension   . Anemia   . Pacemaker   . Cancer ([place])     bladder -   . Hyperlipidemia   . Nephrolithiasis   . Atrial fibrillation [facility])     Past Surgical History:  Past Surgical History  Procedure Laterality Date  . Tonsillectomy    . Bladder removal    . Permanent pacemaker insertion    . Revision of scar on face/head      due to wound   . Av fistula placement Right 10/01/2014    Procedure: INSERTION OF ARTERIOVENOUS (AV) GORE-TEX GRAFT RIGHT ARM;  Surgeon: Elam Dutch, MD;  Location: [place];  Service: Vascular;  Laterality: Right;  . Insert / replace / remove pacemaker    . Cholecystectomy    . Appendectomy    . Tonsillectomy      Family History:  Family History  Problem Relation Age of Onset  . Stroke Mother   . Hypertension Mother   . Varicose Veins Mother   . Heart disease Mother   . Heart attack Father     Social History:  reports that he quit smoking about 35 years ago. He does not have any smokeless tobacco history on file. He reports that he drinks alcohol. He reports that he does not use illicit drugs.  Allergies: No Known Allergies  Medications:   Medication List    ASK your doctor about these medications        aspirin EC 81 MG tablet  Take 81 mg by mouth daily.     atorvastatin 10 MG tablet  Commonly known as:  LIPITOR  Take 10 mg by mouth daily.     calcitRIOL 0.25 MCG capsule  Commonly known as:  ROCALTROL  Take 0.25 mcg by mouth daily.     darbepoetin 60 MCG/0.3ML Soln injection  Commonly known as:  ARANESP  Inject 60 mcg into the skin every 7 (seven) days.     ENSURE  Take 237 mLs by mouth daily.     feeding supplement (NEPRO CARB STEADY) Liqd  Take 237 mLs by mouth as needed (missed meal during dialysis.).     feeding supplement (NEPRO CARB STEADY) Liqd  Take 237 mLs by mouth as needed (missed meal during dialysis.).     metoprolol tartrate 25 MG tablet  Commonly known as:  LOPRESSOR  Take 12.5 mg by mouth 2 (two) times daily.     multivitamin Tabs tablet  Take 1 tablet by mouth at bedtime.     omeprazole 40 MG capsule  Commonly known as:  PRILOSEC  Take 40 mg by mouth daily.     oxyCODONE-acetaminophen 5-325 MG tablet  Commonly known as:  ROXICET  Take 1 tablet by mouth every 6 (six) hours as needed for severe pain.     sevelamer carbonate 800 MG tablet  Commonly known as:  RENVELA  Take 1,600 mg by mouth 3 (three)  times daily with meals.     temazepam 15 MG capsule  Commonly known as:  RESTORIL  Take 15 mg by mouth at bedtime as needed for sleep.     triamcinolone cream 0.1 %  Commonly known as:  KENALOG  Apply 1 application topically 2 (two) times daily.        Please HPI for pertinent positives, otherwise complete 10 system ROS negative.  Mallampati Score: MD Evaluation Airway: WNL Heart: WNL Abdomen: WNL Chest/ Lungs: WNL ASA  Classification: 3 Mallampati/Airway Score: Two  Physical Exam: BP 155/99 mmHg  Pulse 76  Temp(Src) 97.3 F (36.3 C)  Resp 13  Ht 5' 5"  (1.651 m)  Wt 171 lb (77.565 kg)  BMI 28.46 kg/m2  SpO2 97% Body mass index is 28.46 kg/(m^2). General: pleasant, WD, WN white male who is laying in bed in NAD HEENT: head is normocephalic, atraumatic.  Sclera are noninjected.  PERRL.  Ears and nose without any masses or lesions.  Mouth is pink and moist Heart: regular, rate, and rhythm, but paced.  Normal s1,s2. No obvious murmurs, gallops, or rubs noted.  Palpable radial  and pedal pulses bilaterally Lungs: CTAB, no wheezes, rhonchi, or rales noted.  Respiratory effort nonlabored Abd: soft, NT, ND, +BS, no masses, hernias, or organomegaly MS: all 4 extremities are symmetrical with no cyanosis, clubbing, or edema.  He has a right upper extremity graft present.   Psych: A&Ox3 with an appropriate affect.   Labs: Results for orders placed or performed during the hospital encounter of 07/24/15 (from the past 48 hour(s))  Basic metabolic panel     Status: Abnormal   Collection Time: 07/24/15 12:36 PM  Result Value Ref Range   Sodium 142 135 - 145 mmol/L   Potassium 4.8 3.5 - 5.1 mmol/L    Comment: HEMOLYSIS AT THIS LEVEL MAY AFFECT RESULT   Chloride 97 (L) 101 - 111 mmol/L   CO2 29 22 - 32 mmol/L   Glucose, Bld 128 (H) 65 - 99 mg/dL   BUN 57 (H) 6 - 20 mg/dL   Creatinine, Ser 7.45 (H) 0.61 - 1.24 mg/dL   Calcium 8.7 (L) 8.9 - 10.3 mg/dL   GFR calc non Af Amer 6 (L) >60 mL/min   GFR calc Af Amer 7 (L) >60 mL/min    Comment: (NOTE) The eGFR has been calculated using the CKD EPI equation. This calculation has not been validated in all clinical situations. eGFR's persistently <60 mL/min signify possible Chronic Kidney Disease.    Anion gap 16 (H) 5 - 15    Imaging: No results found.  Assessment/Plan 1. Clotted HD graft -will proceed with fistulagram with possible thrombectomy. -Risks and Benefits discussed with the patient including, but not limited to bleeding, infection, vascular injury, pulmonary embolism, need for tunneled HD catheter placement or even death. All of the patient's questions were answered, patient is agreeable to proceed. Consent signed and in chart.   Thank you for this interesting consult.  I greatly enjoyed meeting Robert Morgan and look forward to participating in their care.  A copy of this report was sent to the requesting provider on this date.  Electronically Signed: Henreitta Cea 07/24/2015, 2:06 PM   I spent a  total of  30 Minutes   in face to face in clinical consultation, greater than 50% of which was counseling/coordinating care for clotted HD cath

## 2015-07-24 NOTE — Progress Notes (Signed)
Report to Jocelyn Lamer RN at [facility] ride arranged

## 2015-08-26 ENCOUNTER — Emergency Department (HOSPITAL_COMMUNITY): Payer: Medicare (Managed Care)

## 2015-08-26 ENCOUNTER — Observation Stay (HOSPITAL_COMMUNITY): Payer: Medicare (Managed Care)

## 2015-08-26 ENCOUNTER — Encounter (HOSPITAL_COMMUNITY): Payer: Self-pay

## 2015-08-26 ENCOUNTER — Inpatient Hospital Stay (HOSPITAL_COMMUNITY)
Admission: EM | Admit: 2015-08-26 | Discharge: 2015-08-28 | DRG: 640 | Disposition: A | Discharge status: Home / Self Care | Payer: Medicare (Managed Care) | Attending: Internal Medicine | Admitting: Internal Medicine

## 2015-08-26 DIAGNOSIS — Z8551 Personal history of malignant neoplasm of bladder: Secondary | ICD-10-CM

## 2015-08-26 DIAGNOSIS — Z87891 Personal history of nicotine dependence: Secondary | ICD-10-CM

## 2015-08-26 DIAGNOSIS — Z936 Other artificial openings of urinary tract status: Secondary | ICD-10-CM

## 2015-08-26 DIAGNOSIS — F0280 Dementia in other diseases classified elsewhere without behavioral disturbance: Secondary | ICD-10-CM | POA: Diagnosis present

## 2015-08-26 DIAGNOSIS — N186 End stage renal disease: Secondary | ICD-10-CM | POA: Diagnosis present

## 2015-08-26 DIAGNOSIS — R0603 Acute respiratory distress: Secondary | ICD-10-CM | POA: Diagnosis present

## 2015-08-26 DIAGNOSIS — E8770 Fluid overload, unspecified: Secondary | ICD-10-CM | POA: Diagnosis present

## 2015-08-26 DIAGNOSIS — D696 Thrombocytopenia, unspecified: Secondary | ICD-10-CM | POA: Diagnosis present

## 2015-08-26 DIAGNOSIS — R531 Weakness: Secondary | ICD-10-CM

## 2015-08-26 DIAGNOSIS — Z7982 Long term (current) use of aspirin: Secondary | ICD-10-CM

## 2015-08-26 DIAGNOSIS — R0602 Shortness of breath: Secondary | ICD-10-CM | POA: Diagnosis not present

## 2015-08-26 DIAGNOSIS — R778 Other specified abnormalities of plasma proteins: Secondary | ICD-10-CM | POA: Diagnosis present

## 2015-08-26 DIAGNOSIS — G3183 Dementia with Lewy bodies: Secondary | ICD-10-CM | POA: Diagnosis present

## 2015-08-26 DIAGNOSIS — Z992 Dependence on renal dialysis: Secondary | ICD-10-CM

## 2015-08-26 DIAGNOSIS — J449 Chronic obstructive pulmonary disease, unspecified: Secondary | ICD-10-CM | POA: Diagnosis present

## 2015-08-26 DIAGNOSIS — W19XXXA Unspecified fall, initial encounter: Secondary | ICD-10-CM | POA: Diagnosis present

## 2015-08-26 DIAGNOSIS — N189 Chronic kidney disease, unspecified: Secondary | ICD-10-CM

## 2015-08-26 DIAGNOSIS — I4891 Unspecified atrial fibrillation: Secondary | ICD-10-CM | POA: Diagnosis present

## 2015-08-26 DIAGNOSIS — R0600 Dyspnea, unspecified: Secondary | ICD-10-CM | POA: Diagnosis not present

## 2015-08-26 DIAGNOSIS — Z8589 Personal history of malignant neoplasm of other organs and systems: Secondary | ICD-10-CM

## 2015-08-26 DIAGNOSIS — I120 Hypertensive chronic kidney disease with stage 5 chronic kidney disease or end stage renal disease: Secondary | ICD-10-CM | POA: Diagnosis present

## 2015-08-26 DIAGNOSIS — E785 Hyperlipidemia, unspecified: Secondary | ICD-10-CM | POA: Diagnosis present

## 2015-08-26 DIAGNOSIS — I10 Essential (primary) hypertension: Secondary | ICD-10-CM | POA: Diagnosis present

## 2015-08-26 DIAGNOSIS — J069 Acute upper respiratory infection, unspecified: Secondary | ICD-10-CM | POA: Diagnosis present

## 2015-08-26 DIAGNOSIS — J9601 Acute respiratory failure with hypoxia: Secondary | ICD-10-CM

## 2015-08-26 DIAGNOSIS — Z950 Presence of cardiac pacemaker: Secondary | ICD-10-CM

## 2015-08-26 DIAGNOSIS — R9431 Abnormal electrocardiogram [ECG] [EKG]: Secondary | ICD-10-CM

## 2015-08-26 DIAGNOSIS — R7989 Other specified abnormal findings of blood chemistry: Secondary | ICD-10-CM

## 2015-08-26 DIAGNOSIS — D631 Anemia in chronic kidney disease: Secondary | ICD-10-CM | POA: Diagnosis present

## 2015-08-26 DIAGNOSIS — D649 Anemia, unspecified: Secondary | ICD-10-CM | POA: Diagnosis present

## 2015-08-26 LAB — MRSA PCR SCREENING: MRSA by PCR: NEGATIVE

## 2015-08-26 LAB — CBC WITH DIFFERENTIAL/PLATELET
BASOS ABS: 0 10*3/uL (ref 0.0–0.1)
Basophils Relative: 0 %
Eosinophils Absolute: 0 10*3/uL (ref 0.0–0.7)
Eosinophils Relative: 0 %
HEMATOCRIT: 33.9 % — AB (ref 39.0–52.0)
Hemoglobin: 10.8 g/dL — ABNORMAL LOW (ref 13.0–17.0)
LYMPHS ABS: 1 10*3/uL (ref 0.7–4.0)
Lymphocytes Relative: 14 %
MCH: 33.9 pg (ref 26.0–34.0)
MCHC: 31.9 g/dL (ref 30.0–36.0)
MCV: 106.3 fL — ABNORMAL HIGH (ref 78.0–100.0)
MONOS PCT: 8 %
Monocytes Absolute: 0.6 10*3/uL (ref 0.1–1.0)
NEUTROS PCT: 78 %
Neutro Abs: 5.4 10*3/uL (ref 1.7–7.7)
PLATELETS: 116 10*3/uL — AB (ref 150–400)
RBC: 3.19 MIL/uL — AB (ref 4.22–5.81)
RDW: 16.9 % — AB (ref 11.5–15.5)
WBC: 7 10*3/uL (ref 4.0–10.5)

## 2015-08-26 LAB — URINE MICROSCOPIC-ADD ON: SQUAMOUS EPITHELIAL / LPF: NONE SEEN

## 2015-08-26 LAB — COMPREHENSIVE METABOLIC PANEL
ALK PHOS: 91 U/L (ref 38–126)
ALT: 20 U/L (ref 17–63)
ANION GAP: 19 — AB (ref 5–15)
AST: 15 U/L (ref 15–41)
Albumin: 3.2 g/dL — ABNORMAL LOW (ref 3.5–5.0)
BUN: 83 mg/dL — ABNORMAL HIGH (ref 6–20)
CALCIUM: 8.6 mg/dL — AB (ref 8.9–10.3)
CHLORIDE: 99 mmol/L — AB (ref 101–111)
CO2: 23 mmol/L (ref 22–32)
Creatinine, Ser: 9.57 mg/dL — ABNORMAL HIGH (ref 0.61–1.24)
GFR calc non Af Amer: 4 mL/min — ABNORMAL LOW (ref >60)
GFR, EST AFRICAN AMERICAN: 5 mL/min — AB (ref >60)
Glucose, Bld: 136 mg/dL — ABNORMAL HIGH (ref 65–99)
POTASSIUM: 4.3 mmol/L (ref 3.5–5.1)
SODIUM: 141 mmol/L (ref 135–145)
Total Bilirubin: 0.9 mg/dL (ref 0.3–1.2)
Total Protein: 6.2 g/dL — ABNORMAL LOW (ref 6.5–8.1)

## 2015-08-26 LAB — URINALYSIS, ROUTINE W REFLEX MICROSCOPIC
BILIRUBIN URINE: NEGATIVE
Glucose, UA: NEGATIVE mg/dL
KETONES UR: NEGATIVE mg/dL
Nitrite: NEGATIVE
Specific Gravity, Urine: 1.02 (ref 1.005–1.030)
pH: 8 (ref 5.0–8.0)

## 2015-08-26 LAB — IRON AND TIBC
IRON: 29 ug/dL — AB (ref 45–182)
SATURATION RATIOS: 15 % — AB (ref 17.9–39.5)
TIBC: 197 ug/dL — ABNORMAL LOW (ref 250–450)
UIBC: 168 ug/dL

## 2015-08-26 LAB — RETICULOCYTES
RBC.: 3.04 MIL/uL — AB (ref 4.22–5.81)
Retic Count, Absolute: 36.5 10*3/uL (ref 19.0–186.0)
Retic Ct Pct: 1.2 % (ref 0.4–3.1)

## 2015-08-26 LAB — TROPONIN I
TROPONIN I: 2.73 ng/mL — AB (ref <0.031)
TROPONIN I: 2.84 ng/mL — AB (ref <0.031)
Troponin I: 2.7 ng/mL (ref <0.031)

## 2015-08-26 LAB — FOLATE: FOLATE: 36 ng/mL (ref >5.9)

## 2015-08-26 LAB — VITAMIN B12: Vitamin B-12: 784 pg/mL (ref 180–914)

## 2015-08-26 LAB — CK: Total CK: 181 U/L (ref 49–397)

## 2015-08-26 LAB — PROTIME-INR
INR: 1.2 (ref 0.00–1.49)
Prothrombin Time: 15.4 seconds — ABNORMAL HIGH (ref 11.6–15.2)

## 2015-08-26 LAB — BRAIN NATRIURETIC PEPTIDE: B Natriuretic Peptide: 1922.8 pg/mL — ABNORMAL HIGH (ref 0.0–100.0)

## 2015-08-26 LAB — FERRITIN: Ferritin: 880 ng/mL — ABNORMAL HIGH (ref 24–336)

## 2015-08-26 LAB — TSH: TSH: 1.803 u[IU]/mL (ref 0.350–4.500)

## 2015-08-26 LAB — I-STAT CG4 LACTIC ACID, ED: LACTIC ACID, VENOUS: 1.5 mmol/L (ref 0.5–2.0)

## 2015-08-26 MED ORDER — SODIUM CHLORIDE 0.9% FLUSH
3.0000 mL | Freq: Two times a day (BID) | INTRAVENOUS | Status: DC
  Administered 2015-08-27 (×2): 3 mL via INTRAVENOUS

## 2015-08-26 MED ORDER — TEMAZEPAM 15 MG PO CAPS
15.0000 mg | ORAL_CAPSULE | Freq: Every evening | ORAL | Status: DC | PRN
  Administered 2015-08-26 – 2015-08-27 (×2): 15 mg via ORAL
  Filled 2015-08-26 (×2): qty 1

## 2015-08-26 MED ORDER — ACETAMINOPHEN 650 MG RE SUPP: 650.0000 mg | Freq: Four times a day (QID) | RECTAL | Status: DC | PRN

## 2015-08-26 MED ORDER — IPRATROPIUM-ALBUTEROL 0.5-2.5 (3) MG/3ML IN SOLN
3.0000 mL | Freq: Once | RESPIRATORY_TRACT | Status: AC
  Administered 2015-08-26: 3 mL via RESPIRATORY_TRACT
  Filled 2015-08-26: qty 3

## 2015-08-26 MED ORDER — DARBEPOETIN ALFA-POLYSORBATE 60 MCG/0.3ML IJ SOLN: 60.0000 ug | INTRAMUSCULAR | Status: DC

## 2015-08-26 MED ORDER — ASPIRIN EC 81 MG PO TBEC
81.0000 mg | DELAYED_RELEASE_TABLET | Freq: Every day | ORAL | Status: DC
  Administered 2015-08-27 – 2015-08-28 (×2): 81 mg via ORAL
  Filled 2015-08-26 (×2): qty 1

## 2015-08-26 MED ORDER — DARBEPOETIN ALFA 60 MCG/0.3ML IJ SOSY
60.0000 ug | PREFILLED_SYRINGE | INTRAMUSCULAR | Status: DC
  Administered 2015-08-27: 60 ug via INTRAVENOUS
  Filled 2015-08-26: qty 0.3

## 2015-08-26 MED ORDER — RENA-VITE PO TABS
1.0000 | ORAL_TABLET | Freq: Every day | ORAL | Status: DC
  Administered 2015-08-26 – 2015-08-27 (×2): 1 via ORAL
  Filled 2015-08-26 (×2): qty 1

## 2015-08-26 MED ORDER — SEVELAMER CARBONATE 800 MG PO TABS
1600.0000 mg | ORAL_TABLET | Freq: Three times a day (TID) | ORAL | Status: DC
  Administered 2015-08-27 – 2015-08-28 (×4): 1600 mg via ORAL
  Filled 2015-08-26 (×4): qty 2

## 2015-08-26 MED ORDER — PANTOPRAZOLE SODIUM 40 MG PO TBEC
40.0000 mg | DELAYED_RELEASE_TABLET | Freq: Every day | ORAL | Status: DC
  Administered 2015-08-26 – 2015-08-28 (×3): 40 mg via ORAL
  Filled 2015-08-26 (×3): qty 1

## 2015-08-26 MED ORDER — CALCITRIOL 0.25 MCG PO CAPS
0.2500 ug | ORAL_CAPSULE | Freq: Every day | ORAL | Status: DC
  Administered 2015-08-26 – 2015-08-28 (×3): 0.25 ug via ORAL
  Filled 2015-08-26 (×3): qty 1

## 2015-08-26 MED ORDER — ACETAMINOPHEN 325 MG PO TABS
650.0000 mg | ORAL_TABLET | Freq: Four times a day (QID) | ORAL | Status: DC | PRN
  Administered 2015-08-26 – 2015-08-27 (×3): 650 mg via ORAL
  Filled 2015-08-26 (×2): qty 2

## 2015-08-26 MED ORDER — LEVOFLOXACIN IN D5W 500 MG/100ML IV SOLN
500.0000 mg | Freq: Once | INTRAVENOUS | Status: AC
  Administered 2015-08-26: 500 mg via INTRAVENOUS
  Filled 2015-08-26: qty 100

## 2015-08-26 MED ORDER — HEPARIN SODIUM (PORCINE) 5000 UNIT/ML IJ SOLN
5000.0000 [IU] | Freq: Three times a day (TID) | INTRAMUSCULAR | Status: DC
  Administered 2015-08-26 – 2015-08-28 (×6): 5000 [IU] via SUBCUTANEOUS
  Filled 2015-08-26 (×5): qty 1

## 2015-08-26 NOTE — Progress Notes (Signed)
New Admission Note:   Arrival Method: via stretcher from ED Mental Orientation: Alert and Oriented x4 Telemetry: Placed on box 6E18 Assessment: Completed Skin: Warm, dry. Skin tear Left Forearm with Foam placed, ecchymosis to left Arm and Left Buttock IV: Left Forearm Peripheral IV Normal Saline Locked Pain: 4/10 Right Leg pain Tubes: Urostomy RLQ Safety Measures: Educated on fall prevention safety plan, patient acknowledged and understood. Admission: Completed [address] [place] Orientation: Patient has been orientated to the room, unit and staff.  Family: Friend at Bedside  Orders have been reviewed and implemented. Will continue to monitor the patient. Call light has been placed within reach and bed alarm has been activated.    Dorothea Glassman, RN  Phone number: [phone]

## 2015-08-26 NOTE — ED Notes (Signed)
Attempted report 

## 2015-08-26 NOTE — ED Provider Notes (Signed)
CSN: [id number]     Arrival date & time 08/26/15  [id number] History   First MD Initiated Contact with Patient 08/26/15 [phone]     Chief Complaint  Patient presents with  . Shortness of Breath     (Consider location/radiation/quality/duration/timing/severity/associated sxs/prior Treatment) HPI Patient reports he did not go to dialysis yesterday because he did not feel well. He reports he has just felt generally weak. He denies localizing pain. He did mention that his joint ache. The patient lives alone. He does have known history of Lewy body dementia. He has taken daily to an adult daycare center. Yesterday due to his cough and general condition, the treating provider ordered a chest x-ray and the patient was diagnosed with pneumonia. Yesterday the patient refused to go to dialysis because he stated he did not feel well. At 3 AM the patient reportedly fell in his home. He had used his life alert and EMS assisted him. He refused to come to the hospital at that time. He denies he sustained any injuries. This morning, when transport went to pick the patient up to take him to his adult daycare, he was too weak to stand up. He did not wish to come to the hospital but when assessed by EMS did not have the strength to function independently in his home. The patient's wife is reportedly in constant nursing care and does not live with him. The patient does notably have a very wet cough during examination. Past Medical History  Diagnosis Date  . Chronic kidney disease   . Hypertension   . Anemia   . Pacemaker   . Cancer ([place])     bladder -   . Hyperlipidemia   . Nephrolithiasis   . Atrial fibrillation [facility])    Past Surgical History  Procedure Laterality Date  . Tonsillectomy    . Bladder removal    . Permanent pacemaker insertion    . Revision of scar on face/head      due to wound   . Av fistula placement Right 10/01/2014    Procedure: INSERTION OF ARTERIOVENOUS (AV) GORE-TEX GRAFT RIGHT ARM;  Surgeon:  Elam Dutch, MD;  Location: [place];  Service: Vascular;  Laterality: Right;  . Insert / replace / remove pacemaker    . Cholecystectomy    . Appendectomy    . Tonsillectomy     Family History  Problem Relation Age of Onset  . Stroke Mother   . Hypertension Mother   . Varicose Veins Mother   . Heart disease Mother   . Heart attack Father    Social History  Substance Use Topics  . Smoking status: Former Smoker    Quit date: 04/19/1980  . Smokeless tobacco: None  . Alcohol Use: 0.0 oz/week    0 Standard drinks or equivalent per week    Review of Systems 10 Systems reviewed and are negative for acute change except as noted in the HPI.    Allergies  Review of patient's allergies indicates no known allergies.  Home Medications   Prior to Admission medications   Medication Sig Start Date End Date Taking? Authorizing Provider  acetaminophen (TYLENOL) 325 MG tablet Take 650 mg by mouth every 6 (six) hours as needed for mild pain.   Yes Historical Provider, MD  aspirin EC 81 MG tablet Take 81 mg by mouth daily.   Yes Historical Provider, MD  atorvastatin (LIPITOR) 10 MG tablet Take 10 mg by mouth daily.   Yes Historical Provider,  MD  metoprolol tartrate (LOPRESSOR) 25 MG tablet Take 12.5 mg by mouth 2 (two) times daily.    Yes Historical Provider, MD  multivitamin (RENA-VIT) TABS tablet Take 1 tablet by mouth at bedtime. 11/13/14  Yes Maryann Mikhail, DO  omeprazole (PRILOSEC) 40 MG capsule Take 40 mg by mouth daily.   Yes Historical Provider, MD  sevelamer carbonate (RENVELA) 800 MG tablet Take 1,600 mg by mouth 3 (three) times daily with meals.   Yes Historical Provider, MD  triamcinolone cream (KENALOG) 0.1 % Apply 1 application topically 2 (two) times daily.   Yes Historical Provider, MD  calcitRIOL (ROCALTROL) 0.25 MCG capsule Take 0.25 mcg by mouth daily.    Historical Provider, MD  darbepoetin (ARANESP) 60 MCG/0.3ML SOLN injection Inject 60 mcg into the skin every 7  (seven) days.    Historical Provider, MD  ENSURE (ENSURE) Take 237 mLs by mouth daily.    Historical Provider, MD  temazepam (RESTORIL) 15 MG capsule Take 15 mg by mouth at bedtime as needed for sleep.    Historical Provider, MD   BP 103/63 mmHg  Pulse 69  Temp(Src) 98.2 F (36.8 C) (Oral)  Resp 18  Ht 5\' 5"  (1.651 m)  Wt 171 lb (77.565 kg)  BMI 28.46 kg/m2  SpO2 97% Physical Exam  Constitutional: He is oriented to person, place, and time.  Patient is alert. He is nontoxic. He appears fatigue. Intermittent wet cough.  HENT:  Head: Normocephalic and atraumatic.  Nose: Nose normal.  Mouth/Throat: Oropharynx is clear and moist.  Eyes: EOM are normal. Pupils are equal, round, and reactive to light.  Cardiovascular: Normal rate, regular rhythm and intact distal pulses.   2/6 systolic ejection murmur  Pulmonary/Chest:  Wet cough with deep inspiration. Expiratory wheeze bilateral lung bases. Scattered crackles.  Abdominal: Soft. Bowel sounds are normal. He exhibits no distension. There is no tenderness.  Musculoskeletal: Normal range of motion. He exhibits no tenderness.  1+ pitting edema bilateral lower extremities.  Neurological: He is alert and oriented to person, place, and time. No cranial nerve deficit. He exhibits normal muscle tone.  Skin: Skin is warm and dry.  Psychiatric:  A she is mildly irritable.    ED Course  Procedures (including critical care time) Labs Review Labs Reviewed  COMPREHENSIVE METABOLIC PANEL - Abnormal; Notable for the following:    Chloride 99 (*)    Glucose, Bld 136 (*)    BUN 83 (*)    Creatinine, Ser 9.57 (*)    Calcium 8.6 (*)    Total Protein 6.2 (*)    Albumin 3.2 (*)    GFR calc non Af Amer 4 (*)    GFR calc Af Amer 5 (*)    Anion gap 19 (*)    All other components within normal limits  BRAIN NATRIURETIC PEPTIDE - Abnormal; Notable for the following:    B Natriuretic Peptide 1922.8 (*)    All other components within normal limits   TROPONIN I - Abnormal; Notable for the following:    Troponin I 2.73 (*)    All other components within normal limits  CBC WITH DIFFERENTIAL/PLATELET - Abnormal; Notable for the following:    RBC 3.19 (*)    Hemoglobin 10.8 (*)    HCT 33.9 (*)    MCV 106.3 (*)    RDW 16.9 (*)    Platelets 116 (*)    All other components within normal limits  PROTIME-INR - Abnormal; Notable for the following:  Prothrombin Time 15.4 (*)    All other components within normal limits  CULTURE, BLOOD (ROUTINE X 2)  CULTURE, BLOOD (ROUTINE X 2)  URINALYSIS, ROUTINE W REFLEX MICROSCOPIC (NOT AT [facility])  I-STAT CG4 LACTIC ACID, ED    Imaging Review Dg Chest Port 1 View  08/26/2015  CLINICAL DATA:  Cough and wheezing.  Chronic renal failure EXAM: PORTABLE CHEST 1 VIEW COMPARISON:  Aug 25, 2015 FINDINGS: There is mild bibasilar atelectasis. No edema or consolidation. Heart is upper normal in size with pulmonary vascularity within normal limits. Pacemaker leads are attached to the right atrium and right ventricle. No pneumothorax. Aorta is tortuous but stable. No adenopathy. IMPRESSION: Slight bibasilar atelectasis. No edema or consolidation. Stable cardiac silhouette. Electronically Signed   By: Lowella Grip III M.D.   On: 08/26/2015 10:27   I have personally reviewed and evaluated these images and lab results as part of my medical decision-making.   EKG Interpretation   Date/Time:  Tuesday Aug 26 2015 [id number] EDT Ventricular Rate:  140 PR Interval:    QRS Duration: 176 QT Interval:  432 QTC Calculation: 659 R Axis:   -87 Text Interpretation:  ventricular paced. lateral t wave coving c/w old  Confirmed by Johnney Killian, MD, Jeannie Done [phone]) on 08/26/2015 9:37:30 AM     Consult: Dr. Justin Mend of nephrology is evaluated the patient and the emergency department to arrange dialysis. MDM   Final diagnoses:  Generalized weakness  ESRD on dialysis ([place])  Troponin I above reference range  EKG abnormalities    Patient presents with generalized weakness. Symptoms likely started yesterday and were noted at adult daycare. Patient does not endorse any focal symptoms aside from some diffuse myalgia that he noticed yesterday. Patient denies chest pain. He does have a wet cough but is denying dyspnea. He denies he's had a fever that he is aware of. Patient does have elevated troponin and BNP. These may be in conjunction with renal failure however consideration is also for possible silent MI given the patient's medical comorbidities. He does appear likely volume overloaded with wet cough and chest x-ray shows some vascularization by my review. Plan will be for treatment by nephrology with dialysis and admission to hospitalist for continued medical management of possible pneumonia versus silent MI\congestive heart failure\ESRD with volume overload. Patient does remain alert and appropriate. His mental status is clear with good insight into medical condition and plan. He does not have significant respiratory distress at rest.    Charlesetta Shanks, MD 08/26/15 1359

## 2015-08-26 NOTE — H&P (Signed)
History and Physical    Robert Morgan [id number] DOB: 19-Apr-1928 DOA: 08/26/2015  Referring MD/NP/PA: Johnney Killian / ER PCP: Leota Jacobsen, MD  Outpatient Specialists:  [facility]; [facility] / [facility] Patient coming from: Private residence  Chief Complaint: Cough, weakness and falls  HPI: Robert Morgan is a 82 y.o. male with medical history significant for  chronic kidney disease on dialysis Monday Wednesday Friday in [place], hypertension, anemia of chronic kidney disease, chronic, cytopenia, recurrent falls, history of pacemaker, history of bladder cancer with urostomy. Patient lives alone and attends adult daycare. Apparently has had several falls this week. He is missed dialysis at least one session. After one fall EMS was called to the home but patient refused transport. This morning adult daycare came to check on him and found him to be weak with a wet cough and apparent dyspnea while seated. He was sent to the ER for evaluation.  ED Course:  Afebrile-BP 130/56, pulse 71, respirations 19, O2 saturations 100% on 4 L oxygen; weight 171 pounds (77.5 kg) with dry weight documented at 73.5 kg PCXR: Slight bibasilar atelectasis without edema or consolidation Lab data: Sodium 141, potassium 4.3, BUN 83, creatinine 9.57, glucose 136, albumin 3.2, total protein 6.2, BNP 1923, troponin 2.73, lactic acid 1.5, WBC 7000 with normal differential, hemoglobin 10.8, MCV 106.3, platelets 116,000, coags normal, the cultures obtained in ER DuoNeb 1 Levaquin 500 mg IV 1  Review of Systems:  In addition to the HPI above,  No Fever-chills, myalgias or other constitutional symptoms; complaint of generalized weakness and poor oral intake for 36 hours. No Headache, changes with Vision or hearing, new weakness, tingling, numbness in any extremity, No problems swallowing food or Liquids, indigestion/reflux No Chest pain, patient denies Shortness of Breath, no palpitations, A she denies orthopnea or  DOE No Abdominal pain, N/V; no melena or hematochezia, no dark tarry stools, Bowel movements are regular, No dysuria, hematuria or flank pain No new skin rashes, lesions, masses or bruises, No new joints pains-aches No recent weight gain or loss No polyuria, polydypsia or polyphagia,   Past Medical History  Diagnosis Date  . Chronic kidney disease   . Hypertension   . Anemia   . Pacemaker   . Cancer ([place])     bladder -   . Hyperlipidemia   . Nephrolithiasis   . Atrial fibrillation [facility])     Past Surgical History  Procedure Laterality Date  . Tonsillectomy    . Bladder removal    . Permanent pacemaker insertion    . Revision of scar on face/head      due to wound   . Av fistula placement Right 10/01/2014    Procedure: INSERTION OF ARTERIOVENOUS (AV) GORE-TEX GRAFT RIGHT ARM;  Surgeon: Elam Dutch, MD;  Location: [place];  Service: Vascular;  Laterality: Right;  . Insert / replace / remove pacemaker    . Cholecystectomy    . Appendectomy    . Tonsillectomy       reports that he quit smoking about 35 years ago. He does not have any smokeless tobacco history on file. He reports that he drinks alcohol. He reports that he does not use illicit drugs.  No Known Allergies  Family History  Problem Relation Age of Onset  . Stroke Mother   . Hypertension Mother   . Varicose Veins Mother   . Heart disease Mother   . Heart attack Father      Prior to Admission medications  Medication Sig Start Date End Date Taking? Authorizing Provider  acetaminophen (TYLENOL) 325 MG tablet Take 650 mg by mouth every 6 (six) hours as needed for mild pain.   Yes Historical Provider, MD  aspirin EC 81 MG tablet Take 81 mg by mouth daily.   Yes Historical Provider, MD  atorvastatin (LIPITOR) 10 MG tablet Take 10 mg by mouth daily.   Yes Historical Provider, MD  metoprolol tartrate (LOPRESSOR) 25 MG tablet Take 12.5 mg by mouth 2 (two) times daily.    Yes Historical Provider, MD   multivitamin (RENA-VIT) TABS tablet Take 1 tablet by mouth at bedtime. 11/13/14  Yes Maryann Mikhail, DO  omeprazole (PRILOSEC) 40 MG capsule Take 40 mg by mouth daily.   Yes Historical Provider, MD  sevelamer carbonate (RENVELA) 800 MG tablet Take 1,600 mg by mouth 3 (three) times daily with meals.   Yes Historical Provider, MD  triamcinolone cream (KENALOG) 0.1 % Apply 1 application topically 2 (two) times daily.   Yes Historical Provider, MD  calcitRIOL (ROCALTROL) 0.25 MCG capsule Take 0.25 mcg by mouth daily.    Historical Provider, MD  darbepoetin (ARANESP) 60 MCG/0.3ML SOLN injection Inject 60 mcg into the skin every 7 (seven) days.    Historical Provider, MD  ENSURE (ENSURE) Take 237 mLs by mouth daily.    Historical Provider, MD  temazepam (RESTORIL) 15 MG capsule Take 15 mg by mouth at bedtime as needed for sleep.    Historical Provider, MD    Physical Exam: Filed Vitals:   08/26/15 1200 08/26/15 1300 08/26/15 1330 08/26/15 1415  BP: 103/63 104/74 101/62 99/57  Pulse: 69 90 82 69  Temp:      TempSrc:      Resp: 18 15 18 20   Height:      Weight:      SpO2: 97% 93% 97% 99%      Constitutional: NAD, calm, comfortable Eyes: PERRL, lids and conjunctivae normal ENMT: Mucous membranes are moist. Posterior pharynx clear of any exudate or lesions.Normal dentition.  Neck: normal, supple, no masses, no thyromegaly Respiratory: Coarse lung sounds with bilateral expiratory rhonchi,  Normal respiratory effort. No accessory muscle use. On nasal cannula oxygen-wet sounding nonproductive cough during exam Cardiovascular: Regular rate and rhythm, no murmurs / rubs / gallops. No extremity edema. 2+ pedal pulses. No carotid bruits.  Abdomen: no tenderness, no masses palpated. No hepatosplenomegaly. Bowel sounds positive.  Musculoskeletal: no clubbing / cyanosis. No joint deformity upper and lower extremities. Good ROM, no contractures. Normal muscle tone.  Skin: no rashes, lesions, ulcers. No  induration Neurologic: CN 2-12 grossly intact. Sensation intact, DTR normal. Strength 5/5 x all 4 extremities.  Psychiatric: Alert and oriented x name and place. Normal mood. Wants to know when he is going to be discharged    Labs on Admission: I have personally reviewed following labs and imaging studies  CBC:  Recent Labs Lab 08/26/15 1004  WBC 7.0  NEUTROABS 5.4  HGB 10.8*  HCT 33.9*  MCV 106.3*  PLT 99991111*   Basic Metabolic Panel:  Recent Labs Lab 08/26/15 1004  NA 141  K 4.3  CL 99*  CO2 23  GLUCOSE 136*  BUN 83*  CREATININE 9.57*  CALCIUM 8.6*   GFR: Estimated Creatinine Clearance: 5.3 mL/min (by C-G formula based on Cr of 9.57). Liver Function Tests:  Recent Labs Lab 08/26/15 1004  AST 15  ALT 20  ALKPHOS 91  BILITOT 0.9  PROT 6.2*  ALBUMIN 3.2*   No  results for input(s): LIPASE, AMYLASE in the last 168 hours. No results for input(s): AMMONIA in the last 168 hours. Coagulation Profile:  Recent Labs Lab 08/26/15 1004  INR 1.20   Cardiac Enzymes:  Recent Labs Lab 08/26/15 1004  TROPONINI 2.73*   BNP (last 3 results) No results for input(s): PROBNP in the last 8760 hours. HbA1C: No results for input(s): HGBA1C in the last 72 hours. CBG: No results for input(s): GLUCAP in the last 168 hours. Lipid Profile: No results for input(s): CHOL, HDL, LDLCALC, TRIG, CHOLHDL, LDLDIRECT in the last 72 hours. Thyroid Function Tests: No results for input(s): TSH, T4TOTAL, FREET4, T3FREE, THYROIDAB in the last 72 hours. Anemia Panel: No results for input(s): VITAMINB12, FOLATE, FERRITIN, TIBC, IRON, RETICCTPCT in the last 72 hours. Urine analysis:    Component Value Date/Time   COLORURINE YELLOW 11/09/2014 2054   APPEARANCEUR CLOUDY* 11/09/2014 2054   LABSPEC 1.014 11/09/2014 2054   PHURINE 6.0 11/09/2014 2054   GLUCOSEU NEGATIVE 11/09/2014 2054   HGBUR LARGE* 11/09/2014 2054   [place] NEGATIVE 11/09/2014 2054   KETONESUR 15* 11/09/2014 2054    PROTEINUR >300* 11/09/2014 2054   UROBILINOGEN 0.2 11/09/2014 2054   NITRITE NEGATIVE 11/09/2014 2054   LEUKOCYTESUR TRACE* 11/09/2014 2054   Sepsis Labs: @LABRCNTIP (procalcitonin:4,lacticidven:4) )No results found for this or any previous visit (from the past 240 hour(s)).   Radiological Exams on Admission: Dg Chest Port 1 View  08/26/2015  CLINICAL DATA:  Cough and wheezing.  Chronic renal failure EXAM: PORTABLE CHEST 1 VIEW COMPARISON:  Aug 25, 2015 FINDINGS: There is mild bibasilar atelectasis. No edema or consolidation. Heart is upper normal in size with pulmonary vascularity within normal limits. Pacemaker leads are attached to the right atrium and right ventricle. No pneumothorax. Aorta is tortuous but stable. No adenopathy. IMPRESSION: Slight bibasilar atelectasis. No edema or consolidation. Stable cardiac silhouette. Electronically Signed   By: Lowella Grip III M.D.   On: 08/26/2015 10:27    EKG: (Independently reviewed) 100% and regular paced rhythm with associated underlying left bundle branch block  Assessment/Plan Principal Problem:   Acute respiratory distress /Volume overload -Patient has nonproductive cough without fever, chills, leukocytosis or infiltrate on chest x-ray so doubt pneumonia that is bacterial in etiology although could have viral syndrome -Suspect current symptomatology related to volume overload from missed hemodialysis. Also component of dietary indiscretion with patient reporting "I have never been told to be on a special diet and I eat what I want to eat" -Nephrology plans hemodialysis today-pt is 4 kg over dry weight -BNP elevated at 1923  Active Problems:   CKD (chronic kidney disease) stage V requiring chronic dialysis  -Dialyzes in  on Monday Wednesday Friday  -Anticipate dialysis today     Elevated troponin -No previous baselines for comparison and although you can see higher troponin levels in chronic kidney disease,  2.73 is  somewhat high therefore we'll continue to cycle  -Patient's symptoms of weakness and falls as well as current respiratory symptoms could be related to silent MI so check echocardiogram  -Patient denies chest pain or exertional symptoms such as dyspnea on exertion    HTN (hypertension) -Current blood pressures are suboptimal so will hold beta blocker for now     Anemia in chronic kidney disease (CKD) -Continue preadmission Aranesp  -Repeat anemia panel     Thrombocytopenia  -Chronic issue and platelets are stable     Fall -Suspect mechanical fall although unable to obtain accurate history from patient  -Because  of concerns for possible cardiac ischemia echocardiogram ordered  -PT/OT evaluation  -May not be appropriate to return to home environment alone  -Patient complaining of weakness and since is on statin will check CK to rule out rhabdo    History of pacemaker -100% ventricular paced     History of bladder cancer/Presence of urostomy  -Likely has chronic abnormal urinalysis so we'll check urine culture with you a could have underlying UTI to explain some of his symptoms       DVT prophylaxis:  subcutaneous heparin  Code Status: full code   Family Communication: no family at bedside  Disposition Plan: at this juncture anticipate discharge back to preadmission environment pending PT and OT evaluation; if skilled nursing bed recommended patient may be resistant and may require psych evaluation to determine capacity -wish utilizing adult day care services prior to admission  Consults called: Dr. Justin Mend /nephrology   Admission status:  observation/telemetry  Mobility: patient states without assistive devices although has been having recurrent falls at home   Mousel,ALLISON L. ANP-BC [facility] Pager [phone]   If 7PM-7AM, please contact night-coverage [email or web address] Password [email or web address]  08/26/2015, 2:28 PM

## 2015-08-26 NOTE — ED Notes (Signed)
Pt. Coming from home via [facility]. EMS was called out this morning at 0300 for a fall in the patients home with no injuries. They were called out again by his transport service due to weakness. On scene patient unable to get up out of the chair on his own. Pt. Had chest xray yesterday and diagnosed with left lower lung pneumonia. Pt. refused dialysis yeserday and was last dialyzed Friday. Pt. Hx of lewey body dementia. EMS noted patient to have rhonchi in both lung bases with some expiratory wheezing and gave 5mg  albuterol en route. Pt. Does have pacemaker. Pt. Noted to have wet strong cough. Pt. Aox4.

## 2015-08-26 NOTE — Consult Note (Signed)
Referring Provider: No ref. provider found Primary Care Physician:  Leota Jacobsen, MD Primary Nephrologist:  Dr. Justin Mend  Reason for Consultation:   Medical management of ESRD   CHF and anemia  HPI: HPI: 82 y/o male w/ PMHx of ESRD stage 5 on dialysis [place] MWF, HTN, anemia, HLD, bladder cancer s/p bladder removal with urostomy bag, and atrial fibrillation w/ pacemaker who presented to ER falling and weak. Pt states he has been feeling weak in his knees and legs. He fell and EMS was called. He missed dialysis .  He had a AV graft place in his right upper arm on 10/01/2014 .He was at his adult day care and was found to have a wet cough and sent to the ER  . He has a cough and some mild dyspnea at rest and to conversation. The wbc is normal and no fiever. CXR consistent with mild volume overload. Troponin up above 2  Past Medical History  Diagnosis Date  . Chronic kidney disease   . Hypertension   . Anemia   . Pacemaker   . Cancer ([place])     bladder -   . Hyperlipidemia   . Nephrolithiasis   . Atrial fibrillation [facility])     Past Surgical History  Procedure Laterality Date  . Tonsillectomy    . Bladder removal    . Permanent pacemaker insertion    . Revision of scar on face/head      due to wound   . Av fistula placement Right 10/01/2014    Procedure: INSERTION OF ARTERIOVENOUS (AV) GORE-TEX GRAFT RIGHT ARM;  Surgeon: Elam Dutch, MD;  Location: [place];  Service: Vascular;  Laterality: Right;  . Insert / replace / remove pacemaker    . Cholecystectomy    . Appendectomy    . Tonsillectomy      Prior to Admission medications   Medication Sig Start Date End Date Taking? Authorizing Provider  acetaminophen (TYLENOL) 325 MG tablet Take 650 mg by mouth every 6 (six) hours as needed for mild pain.   Yes Historical Provider, MD  aspirin EC 81 MG tablet Take 81 mg by mouth daily.   Yes Historical Provider, MD  atorvastatin (LIPITOR) 10 MG tablet Take 10 mg by mouth daily.   Yes  Historical Provider, MD  metoprolol tartrate (LOPRESSOR) 25 MG tablet Take 12.5 mg by mouth 2 (two) times daily.    Yes Historical Provider, MD  multivitamin (RENA-VIT) TABS tablet Take 1 tablet by mouth at bedtime. 11/13/14  Yes Maryann Mikhail, DO  omeprazole (PRILOSEC) 40 MG capsule Take 40 mg by mouth daily.   Yes Historical Provider, MD  sevelamer carbonate (RENVELA) 800 MG tablet Take 1,600 mg by mouth 3 (three) times daily with meals.   Yes Historical Provider, MD  triamcinolone cream (KENALOG) 0.1 % Apply 1 application topically 2 (two) times daily.   Yes Historical Provider, MD  calcitRIOL (ROCALTROL) 0.25 MCG capsule Take 0.25 mcg by mouth daily.    Historical Provider, MD  darbepoetin (ARANESP) 60 MCG/0.3ML SOLN injection Inject 60 mcg into the skin every 7 (seven) days.    Historical Provider, MD  ENSURE (ENSURE) Take 237 mLs by mouth daily.    Historical Provider, MD  temazepam (RESTORIL) 15 MG capsule Take 15 mg by mouth at bedtime as needed for sleep.    Historical Provider, MD    No current facility-administered medications for this encounter.   Current Outpatient Prescriptions  Medication Sig Dispense Refill  .  acetaminophen (TYLENOL) 325 MG tablet Take 650 mg by mouth every 6 (six) hours as needed for mild pain.    Marland Kitchen aspirin EC 81 MG tablet Take 81 mg by mouth daily.    Marland Kitchen atorvastatin (LIPITOR) 10 MG tablet Take 10 mg by mouth daily.    . metoprolol tartrate (LOPRESSOR) 25 MG tablet Take 12.5 mg by mouth 2 (two) times daily.     . multivitamin (RENA-VIT) TABS tablet Take 1 tablet by mouth at bedtime. 30 tablet 0  . omeprazole (PRILOSEC) 40 MG capsule Take 40 mg by mouth daily.    . sevelamer carbonate (RENVELA) 800 MG tablet Take 1,600 mg by mouth 3 (three) times daily with meals.    . triamcinolone cream (KENALOG) 0.1 % Apply 1 application topically 2 (two) times daily.    . calcitRIOL (ROCALTROL) 0.25 MCG capsule Take 0.25 mcg by mouth daily.    . darbepoetin (ARANESP) 60  MCG/0.3ML SOLN injection Inject 60 mcg into the skin every 7 (seven) days.    . ENSURE (ENSURE) Take 237 mLs by mouth daily.    . temazepam (RESTORIL) 15 MG capsule Take 15 mg by mouth at bedtime as needed for sleep.      Allergies as of 08/26/2015  . (No Known Allergies)    Family History  Problem Relation Age of Onset  . Stroke Mother   . Hypertension Mother   . Varicose Veins Mother   . Heart disease Mother   . Heart attack Father     Social History   Social History  . Marital Status: Married    Spouse Name: N/A  . Number of Children: N/A  . Years of Education: N/A   Occupational History  . Not on file.   Social History Main Topics  . Smoking status: Former Smoker    Quit date: 04/19/1980  . Smokeless tobacco: Not on file  . Alcohol Use: 0.0 oz/week    0 Standard drinks or equivalent per week  . Drug Use: No  . Sexual Activity: No   Other Topics Concern  . Not on file   Social History Narrative    Review of Systems: Gen: Denies any fever, chills, sweats + fatigue, weakness, malaise HEENT: No visual complaints, No history of Retinopathy. Normal external appearance No Epistaxis or Sore throat. No sinusitis.   CV: Denies chest pain, angina, palpitations, syncope,no  Orthopnea, no  PND, no peripheral edema, and claudication. Resp: +cough, no sputum, or wheezing, GI: Denies vomiting blood, jaundice, and fecal incontinence.   Denies dysphagia or odynophagia. GU : Denies urinary burning, blood in urine, urinary frequency, urinary hesitancy, nocturnal urination, and urinary incontinence.  No renal calculi. MS: Denies joint pain, limitation of movement, and swelling, stiffness, low back pain, extremity pain. Denies muscle weakness, cramps, atrophy.  No use of non steroidal antiinflammatory drugs. Derm: Denies rash, itching, dry skin, hives, moles, warts, or unhealing ulcers.  Psych: Denies depression, anxiety, memory loss, suicidal ideation, hallucinations, paranoia,  and confusion. Heme: Denies bruising, bleeding, and enlarged lymph nodes. Neuro: No headache.  No diplopia. No dysarthria.  No dysphasia.  No history of CVA.  No Seizures. No paresthesias.  No weakness. Endocrine No DM.  No Thyroid disease.  No Adrenal disease.  Physical Exam: Vital signs in last 24 hours: Temp:  [98.2 F (36.8 C)-98.6 F (37 C)] 98.2 F (36.8 C) (05/09 0932) Pulse Rate:  [69-77] 69 (05/09 1200) Resp:  [18-22] 18 (05/09 1200) BP: (97-109)/(52-68) 103/63 mmHg (05/09 1200)  SpO2:  [91 %-100 %] 97 % (05/09 1200) Weight:  [77.565 kg (171 lb)] 77.565 kg (171 lb) (05/09 0932)   General:  Elderly male Head:  Normocephalic and atraumatic. Eyes:  Sclera clear, no icterus.   Conjunctiva pink. Ears:  Normal auditory acuity. Nose:  No deformity, discharge,  or lesions. Mouth:  No deformity or lesions, dentition normal. Neck:  Supple; no masses or thyromegaly. JVP not elevated Lungs: diminished at the bases Heart:  Regular rate and rhythm; no murmurs, clicks, rubs,  or gallops. Abdomen:  Soft, nontender and nondistended. No masses, hepatosplenomegaly or hernias noted. Normal bowel sounds, without guarding, and without rebound.   Msk:  Symmetrical without gross deformities. Normal posture. Pulses:  No carotid, renal, femoral bruits. DP and PT symmetrical and equal Extremities:  Without clubbing or edema. Neurologic:  Alert and  oriented x4;  grossly normal neurologically. Skin:  Intact without significant lesions or rashes. Cervical Nodes:  No significant cervical adenopathy. Psych:  Alert and cooperative. Normal mood and affect.  Intake/Output from previous day:   Intake/Output this shift:    Lab Results:  Recent Labs  08/26/15 1004  WBC 7.0  HGB 10.8*  HCT 33.9*  PLT 116*   BMET  Recent Labs  08/26/15 1004  NA 141  K 4.3  CL 99*  CO2 23  GLUCOSE 136*  BUN 83*  CREATININE 9.57*  CALCIUM 8.6*   LFT  Recent Labs  08/26/15 1004  PROT 6.2*  ALBUMIN  3.2*  AST 15  ALT 20  ALKPHOS 91  BILITOT 0.9   PT/INR  Recent Labs  08/26/15 1004  LABPROT 15.4*  INR 1.20   Hepatitis Panel No results for input(s): HEPBSAG, HCVAB, HEPAIGM, HEPBIGM in the last 72 hours.  Studies/Results: Dg Chest Port 1 View  08/26/2015  CLINICAL DATA:  Cough and wheezing.  Chronic renal failure EXAM: PORTABLE CHEST 1 VIEW COMPARISON:  Aug 25, 2015 FINDINGS: There is mild bibasilar atelectasis. No edema or consolidation. Heart is upper normal in size with pulmonary vascularity within normal limits. Pacemaker leads are attached to the right atrium and right ventricle. No pneumothorax. Aorta is tortuous but stable. No adenopathy. IMPRESSION: Slight bibasilar atelectasis. No edema or consolidation. Stable cardiac silhouette. Electronically Signed   By: Lowella Grip III M.D.   On: 08/26/2015 10:27    Assessment/Plan:  82 year old man admitted with weakness and cough possible Upper respiratory tract infection  Or exacerbation of CHF. He has edema on CXR and received levoquin  1. ESRD   Time 3 hrs 15 min  AVG Right  Dry weight 73.5    2/2.25 BATH    MWF  2. VOLUME   Plan dialysis today  3. Anemia   Mycera  135mcg q 2 weeks  Had dose  5/2   Weekly iron 4. Bones stable no IV Vita D 5. URI treated with levoquin in ER 6. Increased troponin  Cycle and have cardiology evaluate   LOS:  Jesson Foskey W @TODAY @1 :49 PM

## 2015-08-27 ENCOUNTER — Observation Stay (HOSPITAL_BASED_OUTPATIENT_CLINIC_OR_DEPARTMENT_OTHER): Payer: Medicare (Managed Care)

## 2015-08-27 DIAGNOSIS — R0600 Dyspnea, unspecified: Secondary | ICD-10-CM | POA: Diagnosis present

## 2015-08-27 DIAGNOSIS — D631 Anemia in chronic kidney disease: Secondary | ICD-10-CM

## 2015-08-27 DIAGNOSIS — Z87891 Personal history of nicotine dependence: Secondary | ICD-10-CM | POA: Diagnosis not present

## 2015-08-27 DIAGNOSIS — Z992 Dependence on renal dialysis: Secondary | ICD-10-CM | POA: Diagnosis not present

## 2015-08-27 DIAGNOSIS — N186 End stage renal disease: Secondary | ICD-10-CM | POA: Diagnosis present

## 2015-08-27 DIAGNOSIS — G3183 Dementia with Lewy bodies: Secondary | ICD-10-CM | POA: Diagnosis present

## 2015-08-27 DIAGNOSIS — Z950 Presence of cardiac pacemaker: Secondary | ICD-10-CM | POA: Diagnosis not present

## 2015-08-27 DIAGNOSIS — I4891 Unspecified atrial fibrillation: Secondary | ICD-10-CM | POA: Diagnosis present

## 2015-08-27 DIAGNOSIS — E785 Hyperlipidemia, unspecified: Secondary | ICD-10-CM | POA: Diagnosis present

## 2015-08-27 DIAGNOSIS — I10 Essential (primary) hypertension: Secondary | ICD-10-CM

## 2015-08-27 DIAGNOSIS — J069 Acute upper respiratory infection, unspecified: Secondary | ICD-10-CM | POA: Diagnosis present

## 2015-08-27 DIAGNOSIS — J449 Chronic obstructive pulmonary disease, unspecified: Secondary | ICD-10-CM | POA: Diagnosis present

## 2015-08-27 DIAGNOSIS — Z936 Other artificial openings of urinary tract status: Secondary | ICD-10-CM | POA: Diagnosis not present

## 2015-08-27 DIAGNOSIS — R7989 Other specified abnormal findings of blood chemistry: Secondary | ICD-10-CM

## 2015-08-27 DIAGNOSIS — R55 Syncope and collapse: Secondary | ICD-10-CM | POA: Diagnosis not present

## 2015-08-27 DIAGNOSIS — D696 Thrombocytopenia, unspecified: Secondary | ICD-10-CM | POA: Diagnosis present

## 2015-08-27 DIAGNOSIS — F0280 Dementia in other diseases classified elsewhere without behavioral disturbance: Secondary | ICD-10-CM | POA: Diagnosis present

## 2015-08-27 DIAGNOSIS — E8770 Fluid overload, unspecified: Principal | ICD-10-CM

## 2015-08-27 DIAGNOSIS — Z8551 Personal history of malignant neoplasm of bladder: Secondary | ICD-10-CM | POA: Diagnosis not present

## 2015-08-27 DIAGNOSIS — N189 Chronic kidney disease, unspecified: Secondary | ICD-10-CM

## 2015-08-27 DIAGNOSIS — R0602 Shortness of breath: Secondary | ICD-10-CM | POA: Diagnosis present

## 2015-08-27 DIAGNOSIS — W19XXXS Unspecified fall, sequela: Secondary | ICD-10-CM | POA: Diagnosis not present

## 2015-08-27 DIAGNOSIS — D649 Anemia, unspecified: Secondary | ICD-10-CM | POA: Diagnosis present

## 2015-08-27 DIAGNOSIS — I120 Hypertensive chronic kidney disease with stage 5 chronic kidney disease or end stage renal disease: Secondary | ICD-10-CM | POA: Diagnosis present

## 2015-08-27 DIAGNOSIS — Z7982 Long term (current) use of aspirin: Secondary | ICD-10-CM | POA: Diagnosis not present

## 2015-08-27 DIAGNOSIS — Z8589 Personal history of malignant neoplasm of other organs and systems: Secondary | ICD-10-CM | POA: Diagnosis not present

## 2015-08-27 LAB — RENAL FUNCTION PANEL
Albumin: 2.9 g/dL — ABNORMAL LOW (ref 3.5–5.0)
Anion gap: 21 — ABNORMAL HIGH (ref 5–15)
BUN: 94 mg/dL — ABNORMAL HIGH (ref 6–20)
CALCIUM: 8.1 mg/dL — AB (ref 8.9–10.3)
CO2: 22 mmol/L (ref 22–32)
CREATININE: 10.6 mg/dL — AB (ref 0.61–1.24)
Chloride: 99 mmol/L — ABNORMAL LOW (ref 101–111)
GFR calc Af Amer: 4 mL/min — ABNORMAL LOW (ref >60)
GFR calc non Af Amer: 4 mL/min — ABNORMAL LOW (ref >60)
GLUCOSE: 112 mg/dL — AB (ref 65–99)
PHOSPHORUS: 5.3 mg/dL — AB (ref 2.5–4.6)
POTASSIUM: 3.8 mmol/L (ref 3.5–5.1)
Sodium: 142 mmol/L (ref 135–145)

## 2015-08-27 LAB — CBC
HCT: 31.6 % — ABNORMAL LOW (ref 39.0–52.0)
Hemoglobin: 10.2 g/dL — ABNORMAL LOW (ref 13.0–17.0)
MCH: 33.6 pg (ref 26.0–34.0)
MCHC: 32.3 g/dL (ref 30.0–36.0)
MCV: 103.9 fL — AB (ref 78.0–100.0)
PLATELETS: 106 10*3/uL — AB (ref 150–400)
RBC: 3.04 MIL/uL — ABNORMAL LOW (ref 4.22–5.81)
RDW: 16.5 % — AB (ref 11.5–15.5)
WBC: 4.8 10*3/uL (ref 4.0–10.5)

## 2015-08-27 LAB — COMPREHENSIVE METABOLIC PANEL
ALT: 19 U/L (ref 17–63)
AST: 11 U/L — AB (ref 15–41)
Albumin: 2.9 g/dL — ABNORMAL LOW (ref 3.5–5.0)
Alkaline Phosphatase: 82 U/L (ref 38–126)
Anion gap: 20 — ABNORMAL HIGH (ref 5–15)
BUN: 94 mg/dL — AB (ref 6–20)
CHLORIDE: 99 mmol/L — AB (ref 101–111)
CO2: 23 mmol/L (ref 22–32)
CREATININE: 10.68 mg/dL — AB (ref 0.61–1.24)
Calcium: 8.2 mg/dL — ABNORMAL LOW (ref 8.9–10.3)
GFR calc Af Amer: 4 mL/min — ABNORMAL LOW (ref >60)
GFR calc non Af Amer: 4 mL/min — ABNORMAL LOW (ref >60)
GLUCOSE: 117 mg/dL — AB (ref 65–99)
Potassium: 3.8 mmol/L (ref 3.5–5.1)
SODIUM: 142 mmol/L (ref 135–145)
Total Bilirubin: 0.9 mg/dL (ref 0.3–1.2)
Total Protein: 6.2 g/dL — ABNORMAL LOW (ref 6.5–8.1)

## 2015-08-27 LAB — ECHOCARDIOGRAM COMPLETE
Height: 65 in
WEIGHTICAEL: 2437.41 [oz_av]

## 2015-08-27 LAB — GLUCOSE, CAPILLARY: GLUCOSE-CAPILLARY: 101 mg/dL — AB (ref 65–99)

## 2015-08-27 LAB — URINE CULTURE

## 2015-08-27 LAB — TROPONIN I
TROPONIN I: 2.06 ng/mL — AB (ref <0.031)
Troponin I: 1.78 ng/mL (ref <0.031)

## 2015-08-27 LAB — HEMOGLOBIN A1C
HEMOGLOBIN A1C: 5.6 % (ref 4.8–5.6)
MEAN PLASMA GLUCOSE: 114 mg/dL

## 2015-08-27 MED ORDER — SODIUM CHLORIDE 0.9 % IV SOLN: 100.0000 mL | INTRAVENOUS | Status: DC | PRN

## 2015-08-27 MED ORDER — PENTAFLUOROPROP-TETRAFLUOROETH EX AERO: 1.0000 "application " | INHALATION_SPRAY | CUTANEOUS | Status: DC | PRN

## 2015-08-27 MED ORDER — ALTEPLASE 2 MG IJ SOLR: 2.0000 mg | Freq: Once | INTRAMUSCULAR | Status: DC | PRN

## 2015-08-27 MED ORDER — HEPARIN SODIUM (PORCINE) 1000 UNIT/ML DIALYSIS: 1000.0000 [IU] | INTRAMUSCULAR | Status: DC | PRN

## 2015-08-27 MED ORDER — LIDOCAINE-PRILOCAINE 2.5-2.5 % EX CREA: 1.0000 "application " | TOPICAL_CREAM | CUTANEOUS | Status: DC | PRN

## 2015-08-27 MED ORDER — ACETAMINOPHEN 325 MG PO TABS
ORAL_TABLET | ORAL | Status: AC
  Filled 2015-08-27: qty 2

## 2015-08-27 MED ORDER — DARBEPOETIN ALFA 60 MCG/0.3ML IJ SOSY
PREFILLED_SYRINGE | INTRAMUSCULAR | Status: AC
  Administered 2015-08-27: 10:00:00
  Filled 2015-08-27: qty 0.3

## 2015-08-27 MED ORDER — LIDOCAINE HCL (PF) 1 % IJ SOLN: 5.0000 mL | INTRAMUSCULAR | Status: DC | PRN

## 2015-08-27 NOTE — NC FL2 (Signed)
[place] LEVEL OF CARE SCREENING TOOL     IDENTIFICATION  Patient Name: Robert Morgan Birthdate: [birth year] Sex: male Admission Date (Current Location): 08/26/2015  [facility] and [place] Number:  [employer] and Address:  [facility] [place], [place] [address], [place], [place] [address]      Provider Number: [id number]  Attending Physician Name and Address:  Albertine Patricia, MD  Relative Name and Phone Number:  Octavio Graves - daughter - [phone]    Current Level of Care: Hospital Recommended Level of Care: [place] Prior Approval Number:    Date Approved/Denied:   PASRR Number: [id number] (Eff. 11/13/14)  Discharge Plan: SNF    Current Diagnoses: Patient Active Problem List   Diagnosis Date Noted  . Elevated troponin 08/26/2015  . Acute respiratory distress (HCC) 08/26/2015  . Volume overload 08/26/2015  . Thrombocytopenia ([place]) 08/26/2015  . Fall 08/26/2015  . ESRD on dialysis ([place])   . Adjustment disorder with mixed anxiety and depressed mood 11/15/2014  . CKD (chronic kidney disease) stage V requiring chronic dialysis ([place]) 11/09/2014  . HTN (hypertension) 11/09/2014  . History of pacemaker 11/09/2014  . History of bladder cancer 11/09/2014  . Presence of urostomy ([place]) 11/09/2014  . Anemia in chronic kidney disease (CKD) 11/09/2014    Orientation RESPIRATION BLADDER Height & Weight    Oriented to person, place, time and situation.    Normal Continent Weight: 152 lb 5.4 oz (69.1 kg) Height:  5\' 5"  (165.1 cm)  BEHAVIORAL SYMPTOMS/MOOD NEUROLOGICAL BOWEL NUTRITION STATUS      Continent (Patient has urostomy (ostomy pouch)) Diet (Renal with 1200 mL fluid restriction)  AMBULATORY STATUS COMMUNICATION OF NEEDS Skin    Mod assist-walked 20 feet with rolling walker. Verbally Normal                       Personal Care Assistance Level of Assistance  Bathing, Feeding, Dressing Bathing Assistance:  Maximum assistance Feeding assistance: Independent Dressing Assistance: Maximum assistance     Functional Limitations Info  Sight, Hearing, Speech Sight Info: Adequate Hearing Info: Adequate Speech Info: Adequate    SPECIAL CARE FACTORS FREQUENCY  PT (By licensed PT), OT (By licensed OT)  Evaluated by PT on 5/10 and a minimum of 3X per week therapy recommended.    OT Frequency: Evaluated 5/10 and a minimum of 2X per week recommended            Contractures Contractures Info: Not present    Additional Factors Info  Code Status, Allergies Code Status Info: Full Code Allergies Info: No known allergies           Current Medications (08/27/2015):  This is the current hospital active medication list Current Facility-Administered Medications  Medication Dose Route Frequency Provider Last Rate Last Dose  . acetaminophen (TYLENOL) tablet 650 mg  650 mg Oral Q6H PRN Samella Parr, NP   650 mg at 08/27/15 0841   Or  . acetaminophen (TYLENOL) suppository 650 mg  650 mg Rectal Q6H PRN Samella Parr, NP      . aspirin EC tablet 81 mg  81 mg Oral Daily Samella Parr, NP   81 mg at 08/27/15 1242  . calcitRIOL (ROCALTROL) capsule 0.25 mcg  0.25 mcg Oral Daily Samella Parr, NP   0.25 mcg at 08/27/15 1242  . Darbepoetin Alfa (ARANESP) injection 60 mcg  60 mcg Intravenous Q Wed-HD Wynell Balloon, [place]  60 mcg at 08/27/15 1014  . heparin injection 5,000 Units  5,000 Units Subcutaneous Q8H Samella Parr, NP   5,000 Units at 08/27/15 1242  . multivitamin (RENA-VIT) tablet 1 tablet  1 tablet Oral QHS Samella Parr, NP   1 tablet at 08/26/15 2228  . pantoprazole (PROTONIX) EC tablet 40 mg  40 mg Oral Daily Samella Parr, NP   40 mg at 08/27/15 1242  . sevelamer carbonate (RENVELA) tablet 1,600 mg  1,600 mg Oral TID WC Samella Parr, NP   1,600 mg at 08/27/15 1242  . sodium chloride flush (NS) 0.9 % injection 3 mL  3 mL Intravenous Q12H Samella Parr, NP   3 mL at 08/27/15 0640   . temazepam (RESTORIL) capsule 15 mg  15 mg Oral QHS PRN Samella Parr, NP   15 mg at 08/26/15 2228     Discharge Medications: Please see discharge summary for a list of discharge medications.  Relevant Imaging Results:  Relevant Lab Results:   Additional Information [phone]. DIALYSIS PATIENT: MWF [place]. [facility] Participant.  Sable Feil, LCSW

## 2015-08-27 NOTE — Evaluation (Signed)
Physical Therapy Evaluation Patient Details Name: Robert Morgan MRN: [id number] DOB: 07/04/1927 Today's Date: 08/27/2015   History of Present Illness  Robert Morgan is a 82 y.o. male with a Past Medical History of HTN, COPD, anemia, pacemaker, bladder cancer, HLD, A. fib who presents with physical deconditioning and fluid overload likely from missed dialysis.   Clinical Impression   Pt admitted with above diagnosis. Pt currently with functional limitations due to the deficits listed below (see PT Problem List).  Pt will benefit from skilled PT to increase their independence and safety with mobility to allow discharge to the venue listed below.    Pt states he is independent; Still, noted recent falls in the home; Spoke with Abigail Butts, PT at [facility]; they provide a personal care attendant daily; they have recommended a RW, but he refuses; his driver's license has been revoked, but it is possible that he still drives; he has very little family support; wife is at SNF     Follow Up Recommendations Supervision/Assistance - 24 hour;SNF (Pt receives therapy services from [facility]; It will benefit him to continue to receive therapy from them; I still believe he needs 24 hour supervision -- perhaps SNF or ALF for skilled nursing bed and continue to go to [facility] for PT/OT?)    Equipment Recommendations  Rolling walker with 5" wheels;3in1 (PT)    Recommendations for Other Services       Precautions / Restrictions Precautions Precautions: Fall Restrictions Weight Bearing Restrictions: No      Mobility  Bed Mobility Overal bed mobility: Needs Assistance Bed Mobility: Supine to Sit     Supine to sit: Mod assist Sit to supine: Mod assist   General bed mobility comments: assist for legs and trunk.  Utilized pad to help pt get to EOB  Transfers Overall transfer level: Needs assistance Equipment used: Rolling walker (2 wheeled) Transfers: Sit to/from  Stand Sit to Stand: Mod assist         General transfer comment: Heavy mod assist to stand for power up and anterior weight shift; significant posterior lean and tending to brace LEs against bed for stability  Ambulation/Gait Ambulation/Gait assistance: Min assist;Mod assist Ambulation Distance (Feet): 20 Feet Assistive device: Rolling walker (2 wheeled) Gait Pattern/deviations: Shuffle;Decreased step length - right;Decreased step length - left;Trunk flexed     General Gait Details: Short, inefficient steps and significant trunk flexion; Mod assist at times for RW management; balance deficits evident  Stairs            Wheelchair Mobility    Modified Rankin (Stroke Patients Only)       Balance Overall balance assessment: Needs assistance Sitting-balance support: Bilateral upper extremity supported Sitting balance-Leahy Scale: Fair       Standing balance-Leahy Scale: Poor                               Pertinent Vitals/Pain Pain Assessment: No/denies pain    Home Living Family/patient expects to be discharged to:: Private residence Living Arrangements: Alone Available Help at Discharge: Personal care attendant (from [facility]; daily) Type of Home: House Home Access: Ramped entrance     Home Layout: One level Home Equipment: Walker - 2 wheels (Spouse's) Additional Comments: had BSC but didn't like it.  States he doesn't use a walker at home    Prior Function Level of Independence: Independent         Comments:  Pt states he is independent; Still, noted recent falls in the home; Spoke with Abigail Butts, PT at [facility]; they provide a personal care attendant daily; they have recommended a RW, but he refuses; his driver's license has been revoked, but it is possible that he still drives; he has very little family support; wife is at [facility]     Hand Dominance        Extremity/Trunk Assessment   Upper Extremity Assessment: Defer to OT  evaluation           Lower Extremity Assessment: Generalized weakness         Communication   Communication: No difficulties  Cognition Arousal/Alertness: Awake/alert (However eyes closed at least 50 % of the session) Behavior During Therapy: [facility] for tasks assessed/performed Overall Cognitive Status:  (Relatively new diagnosis of Lewy Body dementia)                      General Comments      Exercises        Assessment/Plan    PT Assessment Patient needs continued PT services  PT Diagnosis Difficulty walking;Generalized weakness;Other (comment) (Gait and balance dysfunction)   PT Problem List Decreased strength;Decreased range of motion;Decreased activity tolerance;Decreased balance;Decreased mobility;Decreased coordination;Decreased cognition;Decreased knowledge of use of DME;Decreased safety awareness;Decreased knowledge of precautions;Cardiopulmonary status limiting activity  PT Treatment Interventions DME instruction;Gait training;Functional mobility training;Therapeutic activities;Therapeutic exercise;Balance training;Neuromuscular re-education;Cognitive remediation;Patient/family education   PT Goals (Current goals can be found in the Care Plan section) Acute Rehab PT Goals Patient Stated Goal: home PT Goal Formulation: Patient unable to participate in goal setting Time For Goal Achievement: 09/10/15 Potential to Achieve Goals: Fair    Frequency Min 3X/week   Barriers to discharge Decreased caregiver support Is alone at times in his home; frequent falls; concerns for safety in his home    Co-evaluation               End of Session Equipment Utilized During Treatment: Gait belt Activity Tolerance: Patient tolerated treatment well Patient left: in chair;with call bell/phone within reach;with chair alarm set Nurse Communication: Mobility status    Functional Assessment Tool Used: Clinical judgement Functional Limitation: Mobility: Walking and  moving around Mobility: Walking and Moving Around Current Status [phone]): At least 40 percent but less than 60 percent impaired, limited or restricted Mobility: Walking and Moving Around Goal Status [phone]): At least 1 percent but less than 20 percent impaired, limited or restricted    Time: 1520-1540 PT Time Calculation (min) (ACUTE ONLY): 20 min   Charges:   PT Evaluation $PT Eval Moderate Complexity: 1 Procedure     PT G Codes:   PT G-Codes **NOT FOR INPATIENT CLASS** Functional Assessment Tool Used: Clinical judgement Functional Limitation: Mobility: Walking and moving around Mobility: Walking and Moving Around Current Status [id number]): At least 40 percent but less than 60 percent impaired, limited or restricted Mobility: Walking and Moving Around Goal Status [phone]): At least 1 percent but less than 20 percent impaired, limited or restricted    Roney Marion [facility] 08/27/2015, 4:37 PM  Roney Marion, [place] Pager [phone] Office [phone]

## 2015-08-27 NOTE — Progress Notes (Signed)
PT Cancellation Note  Patient Details Name: Robert Morgan MRN: [id number] DOB: Mar 07, 1928   Cancelled Treatment:    Reason Eval/Treat Not Completed: Patient at procedure or test/unavailable   Currently in HD;  Will follow up later today as time allows;  Otherwise, will follow up for PT tomorrow;   Thank you,  Roney Marion, [place] Pager [phone] Office [phone]     [name], Robert Morgan 08/27/2015, 11:01 AM

## 2015-08-27 NOTE — Evaluation (Signed)
Occupational Therapy Evaluation Patient Details Name: Robert Morgan MRN: [id number] DOB: 10/12/1927 Today's Date: 08/27/2015    History of Present Illness Robert Morgan is a 82 y.o. male with a Past Medical History of HTN, COPD, anemia, pacemaker, bladder cancer, HLD, A. fib who presents with physical deconditioning and fluid overload likely from missed dialysis.    Clinical Impression   Pt was admitted for the above.  Per chart, he lived alone and he reports that he did not have assistance for adls.  He currently needs up to max A for LB adls and he needs min A for transfers with use of RW. Goals in acute are for min guard to min A.      Follow Up Recommendations  SNF;Supervision/Assistance - 24 hour    Equipment Recommendations  3 in 1 bedside comode    Recommendations for Other Services       Precautions / Restrictions Precautions Precautions: Fall Restrictions Weight Bearing Restrictions: No      Mobility Bed Mobility Overal bed mobility: Needs Assistance Bed Mobility: Supine to Sit;Sit to Supine     Supine to sit: Mod assist Sit to supine: Mod assist   General bed mobility comments: assist for legs and trunk.  Utilized pad to help pt get to EOB  Transfers Overall transfer level: Needs assistance Equipment used: None;Rolling walker (2 wheeled) Transfers: Sit to/from Stand Sit to Stand: Min assist;Mod assist         General transfer comment: min A with RW; mod A without.  Multimodal cues for UE placement    Balance                                            ADL Overall ADL's : Needs assistance/impaired     Grooming: Set up;Sitting   Upper Body Bathing: Set up;Sitting   Lower Body Bathing: Moderate assistance;Sit to/from stand   Upper Body Dressing : Minimal assistance;Sitting   Lower Body Dressing: Maximal assistance;Sit to/from stand   Toilet Transfer: Minimal assistance;Ambulation;RW (bed)             General ADL  Comments: pt attempted to pull socks up.  Able to partially do so, then lost hold of sock but then kept attempting when he lost hold of sock.  Ambulated around bed.  Pt took very small steps and stopped a couple of times.  Pt with decreased awareness of deficits and answers were contradictory at times.  Per chart, pt was at home alone (he states wife is in a SNF) and he went to adult day care and HD with transportation provided     Vision     Perception     Praxis      Pertinent Vitals/Pain Pain Assessment: No/denies pain     Hand Dominance     Extremity/Trunk Assessment Upper Extremity Assessment Upper Extremity Assessment: Generalized weakness (LUE resting tremor; able to use hand)           Communication Communication Communication: No difficulties   Cognition Arousal/Alertness: Awake/alert Behavior During Therapy: WFL for tasks assessed/performed Overall Cognitive Status: No family/caregiver present to determine baseline cognitive functioning (h/o Lewy body dementia)                     General Comments       Exercises       Shoulder Instructions  Home Living Family/patient expects to be discharged to:: Private residence Living Arrangements: Alone                 Bathroom Shower/Tub: Occupational psychologist: [place]: Environmental consultant - 2 wheels   Additional Comments: had BSC but didn't like it.  States he doesn't use a walker at home      Prior Functioning/Environment Level of Independence: Independent        Comments: Pt states he is independent; Still, noted recent falls in the home    OT Diagnosis: Generalized weakness   OT Problem List: Decreased strength;Decreased activity tolerance;Impaired balance (sitting and/or standing);Decreased cognition;Decreased safety awareness;Pain   OT Treatment/Interventions: Self-care/ADL training;DME and/or AE instruction;Balance training;Patient/family education;Cognitive  remediation/compensation;Therapeutic activities    OT Goals(Current goals can be found in the care plan section) Acute Rehab OT Goals Patient Stated Goal: home OT Goal Formulation: With patient Time For Goal Achievement: 09/03/15 Potential to Achieve Goals: Good ADL Goals Pt Will Perform Grooming: with min guard assist;standing Pt Will Perform Lower Body Bathing: with min guard assist;sit to/from stand;with adaptive equipment Pt Will Perform Lower Body Dressing: with min assist;with adaptive equipment;sit to/from stand Pt Will Transfer to Toilet: with min guard assist;ambulating;bedside commode Pt Will Perform Toileting - Clothing Manipulation and hygiene: with min guard assist;sit to/from stand  OT Frequency: Min 2X/week   Barriers to D/C:            Co-evaluation              End of Session    Activity Tolerance: Patient tolerated treatment well Patient left: in bed;with call bell/phone within reach;with bed alarm set   Time: [id number] OT Time Calculation (min): 23 min Charges:  OT General Charges $OT Visit: 1 Procedure OT Evaluation $OT Eval Moderate Complexity: 1 Procedure G-Codes: OT G-codes **NOT FOR INPATIENT CLASS** Functional Assessment Tool Used: clinical judgment and observation Functional Limitation: Self care Self Care Current Status [id number]): At least 60 percent but less than 80 percent impaired, limited or restricted Self Care Goal Status [id number]): At least 20 percent but less than 40 percent impaired, limited or restricted  [place] 08/27/2015, 3:20 PM Lesle Chris, OTR/L [phone] 08/27/2015

## 2015-08-27 NOTE — Progress Notes (Signed)
PROGRESS NOTE                                                                                                                                                                                                             Patient Demographics:    Robert Morgan, is a 82 y.o. male, DOB - [birth year], IC:[id number]  Admit date - 08/26/2015   Admitting Physician Waldemar Dickens, MD  Outpatient Primary MD for the patient is Leota Jacobsen, MD  LOS - 1   Chief Complaint  Patient presents with  . Shortness of Breath       Brief Narrative   82 y.o. male with a Past Medical History of HTN, COPD, anemia, pacemaker, bladder cancer, HLD, A. fib , ESRD on HD who presents with physical deconditioning and fluid overload likely from missed dialysis, Scheduled patient PCP, poor chest x-ray as an outpatient significant for pneumonia, but patient afebrile, no leukocytosis, repeat chest x-ray with no evidence of opacity during hospital stay.    Subjective:    Robert Morgan today has, No headache, No chest pain, No abdominal pain - No Nausea, Reports he is feeling much better today, reports occasional cough, nonproductive.  Assessment  & Plan :    Principal Problem:   Volume overload Active Problems:   CKD (chronic kidney disease) stage V requiring chronic dialysis (HCC)   HTN (hypertension)   History of pacemaker   History of bladder cancer   Presence of urostomy (HCC)   Anemia in chronic kidney disease (CKD)   Elevated troponin   Acute respiratory distress (HCC)   Thrombocytopenia (HCC)   Fall   ESRD on dialysis [facility])  Acute respiratory distress /Volume overload -Patient has nonproductive cough without fever, chills, leukocytosis or infiltrate on chest x-ray so doubt pneumonia that is bacterial in etiology although could have viral syndrome - Significant improvement after hemodialysis today -pt is 4 kg over dry weight on admission -BNP elevated at  1923  ESRD -Dialyzes in [place] on Monday Wednesday Friday    Elevated troponin - Denies any chest pain or shortness of breath -Troponin trending down, elevation most likely to ESRD - Follow on 2-D echo   HTN (hypertension) -Susceptible, continue to monitor   Anemia in chronic kidney disease (CKD) -Continue preadmission Aranesp   Thrombocytopenia  -Chronic issue and platelets are  stable    Fall - Suspect mechanical fall although unable to obtain accurate history from patient  - PT/OT evaluation  - Patient with fairly Lewys body dementia as discussion with PCP   History of pacemaker -100% ventricular paced   History of bladder cancer/Presence of urostomy  -Likely has chronic abnormal urinalysis so we'll check urine culture with you a could have underlying UTI to explain some of his symptoms         Code Status : Full  Family Communication  : none at bedside  Disposition Plan  : pending PT  Consults  :  renal  Procedures  : none  DVT Prophylaxis  :   Heparin  Lab Results  Component Value Date   PLT 106* 08/27/2015    Antibiotics  :    Anti-infectives    Start     Dose/Rate Route Frequency Ordered Stop   08/26/15 1000  levofloxacin (LEVAQUIN) IVPB 500 mg     500 mg 100 mL/hr over 60 Minutes Intravenous  Once 08/26/15 0952 08/26/15 1132        Objective:   Filed Vitals:   08/27/15 1004 08/27/15 1032 08/27/15 1054 08/27/15 1100  BP: 91/58 94/56 88/55  120/96  Pulse: 70 62 72 95  Temp:    97.7 F (36.5 C)  TempSrc:    Oral  Resp:    18  Height:      Weight:    69.1 kg (152 lb 5.4 oz)  SpO2:    98%    Wt Readings from Last 3 Encounters:  08/27/15 69.1 kg (152 lb 5.4 oz)  07/24/15 77.565 kg (171 lb)  05/22/15 74.844 kg (165 lb)     Intake/Output Summary (Last 24 hours) at 08/27/15 1256 Last data filed at 08/27/15 1100  Gross per 24 hour  Intake    600 ml  Output   2827 ml  Net  -2227 ml     Physical Exam  Awake  Alert, Oriented  Leslie.AT,PERRAL Supple Neck,No JVD,  Symmetrical Chest wall movement, Good air movement bilaterally, CTAB No Gallops,Rubs or new Murmurs, No Parasternal Heave +ve B.Sounds, Abd Soft, No tenderness,No rebound - guarding or rigidity. No Cyanosis, Clubbing or edema, No new Rash or bruise      Data Review:    CBC  Recent Labs Lab 08/26/15 1004 08/27/15 0822  WBC 7.0 4.8  HGB 10.8* 10.2*  HCT 33.9* 31.6*  PLT 116* 106*  MCV 106.3* 103.9*  MCH 33.9 33.6  MCHC 31.9 32.3  RDW 16.9* 16.5*  LYMPHSABS 1.0  --   MONOABS 0.6  --   EOSABS 0.0  --   BASOSABS 0.0  --     Chemistries   Recent Labs Lab 08/26/15 1004 08/27/15 0822 08/27/15 0823  NA 141 142 142  K 4.3 3.8 3.8  CL 99* 99* 99*  CO2 23 23 22   GLUCOSE 136* 117* 112*  BUN 83* 94* 94*  CREATININE 9.57* 10.68* 10.60*  CALCIUM 8.6* 8.2* 8.1*  AST 15 11*  --   ALT 20 19  --   ALKPHOS 91 82  --   BILITOT 0.9 0.9  --    ------------------------------------------------------------------------------------------------------------------ No results for input(s): CHOL, HDL, LDLCALC, TRIG, CHOLHDL, LDLDIRECT in the last 72 hours.  Lab Results  Component Value Date   HGBA1C 5.6 08/26/2015   ------------------------------------------------------------------------------------------------------------------  Recent Labs  08/26/15 1419  TSH 1.803   ------------------------------------------------------------------------------------------------------------------  Recent Labs  08/26/15 1419  VITAMINB12 784  FOLATE 36.0  FERRITIN 880*  TIBC 197*  IRON 29*  RETICCTPCT 1.2    Coagulation profile  Recent Labs Lab 08/26/15 1004  INR 1.20    No results for input(s): DDIMER in the last 72 hours.  Cardiac Enzymes  Recent Labs Lab 08/26/15 1929 08/27/15 0203 08/27/15 0822  TROPONINI 2.70* 2.06* 1.78*    ------------------------------------------------------------------------------------------------------------------    Component Value Date/Time   BNP 1922.8* 08/26/2015 1004    Inpatient Medications  Scheduled Meds: . aspirin EC  81 mg Oral Daily  . calcitRIOL  0.25 mcg Oral Daily  . darbepoetin (ARANESP) injection - DIALYSIS  60 mcg Intravenous Q Wed-HD  . heparin  5,000 Units Subcutaneous Q8H  . multivitamin  1 tablet Oral QHS  . pantoprazole  40 mg Oral Daily  . sevelamer carbonate  1,600 mg Oral TID WC  . sodium chloride flush  3 mL Intravenous Q12H   Continuous Infusions:  PRN Meds:.acetaminophen **OR** acetaminophen, temazepam  Micro Results Recent Results (from the past 240 hour(s))  Culture, blood (routine x 2)     Status: None (Preliminary result)   Collection Time: 08/26/15 10:04 AM  Result Value Ref Range Status   Specimen Description BLOOD LEFT FOREARM  Final   Special Requests IN PEDIATRIC BOTTLE 3CC  Final   Culture NO GROWTH 1 DAY  Final   Report Status PENDING  Incomplete  Culture, blood (routine x 2)     Status: None (Preliminary result)   Collection Time: 08/26/15 10:10 AM  Result Value Ref Range Status   Specimen Description BLOOD LEFT ANTECUBITAL  Final   Special Requests BOTTLES DRAWN AEROBIC ONLY 5CC  Final   Culture NO GROWTH 1 DAY  Final   Report Status PENDING  Incomplete  MRSA PCR Screening     Status: None   Collection Time: 08/26/15  5:27 PM  Result Value Ref Range Status   MRSA by PCR NEGATIVE NEGATIVE Final    Comment:        The GeneXpert MRSA Assay (FDA approved for NASAL specimens only), is one component of a comprehensive MRSA colonization surveillance program. It is not intended to diagnose MRSA infection nor to guide or monitor treatment for MRSA infections.     Radiology Reports X-ray Chest Pa And Lateral  08/26/2015  CLINICAL DATA:  82 year old male with shortness of breath EXAM: CHEST  2 VIEW COMPARISON:  Chest  radiograph dated 08/26/2015 08/25/2015 FINDINGS: Two views of the chest do not demonstrate a focal consolidation. There is minimal bibasilar atelectatic changes. There is no pleural effusion or pneumothorax. Evaluation of the right apical region is limited due to superimposition of the patient's mandibular soft tissues. The cardiac silhouette is within normal limits. Left pectoral pacemaker device. No acute osseous pathology IMPRESSION: No active cardiopulmonary disease. Electronically Signed   By: Anner Crete M.D.   On: 08/26/2015 18:43   Dg Chest Port 1 View  08/26/2015  CLINICAL DATA:  Cough and wheezing.  Chronic renal failure EXAM: PORTABLE CHEST 1 VIEW COMPARISON:  Aug 25, 2015 FINDINGS: There is mild bibasilar atelectasis. No edema or consolidation. Heart is upper normal in size with pulmonary vascularity within normal limits. Pacemaker leads are attached to the right atrium and right ventricle. No pneumothorax. Aorta is tortuous but stable. No adenopathy. IMPRESSION: Slight bibasilar atelectasis. No edema or consolidation. Stable cardiac silhouette. Electronically Signed   By: Lowella Grip III M.D.   On: 08/26/2015 10:27    Time Spent in minutes  25   ELGERGAWY,  DAWOOD M.D on 08/27/2015 at 12:56 PM  Between 7am to 7pm - Pager - [phone]  After 7pm go to [email or web address] - password [facility]  [facility] -  Office  [phone]

## 2015-08-27 NOTE — Procedures (Signed)
I have seen and examined this patient and agree with the plan of care    Seen on dialysis  Labs pending this morning  Troponin 2.7 -- 2.06  Berklee Battey W 08/27/2015, 8:21 AM

## 2015-08-27 NOTE — Progress Notes (Signed)
OT Cancellation Note  Patient Details Name: SHARN DUGAN MRN: [id number] DOB: 1927/09/03   Cancelled Treatment:    Reason Eval/Treat Not Completed: Patient at procedure or test/ unavailable:  HD.  Kayleann Mccaffery 08/27/2015, 10:13 AM  Lesle Chris, OTR/L [phone] 08/27/2015

## 2015-08-27 NOTE — Progress Notes (Signed)
  Echocardiogram 2D Echocardiogram has been performed.  Robert Morgan 08/27/2015, 3:09 PM

## 2015-08-27 NOTE — Progress Notes (Signed)
PT Cancellation Note  Patient Details Name: Robert Morgan MRN: [id number] DOB: 1928-02-08   Cancelled Treatment:    Reason Eval/Treat Not Completed: Patient at procedure or test/unavailable   Will follow up later today as time allows;  Otherwise, will follow up for PT tomorrow;   Thank you,  Roney Marion, [place] Pager [phone] Office [phone]     Roney Marion [facility] 08/27/2015, 2:26 PM

## 2015-08-28 DIAGNOSIS — W19XXXS Unspecified fall, sequela: Secondary | ICD-10-CM

## 2015-08-28 MED ORDER — WHITE PETROLATUM GEL
Status: AC
  Filled 2015-08-28: qty 1

## 2015-08-28 NOTE — Discharge Summary (Signed)
Robert Morgan, is a 82 y.o. male  DOB 20-Jun-1927  MRN [id number].  Admission date:  08/26/2015  Admitting Physician  Waldemar Dickens, MD  Discharge Date:  08/28/2015   Primary MD  Leota Jacobsen, MD  Recommendations for primary care physician for things to follow:  - Patient will need to follow with his primary cardiologist regarding further workup if indicated about 2-D echo finding, and pacemaker interrogation (supposed to be on 08/26/2015), scheduled an appointment with [facility] cardiology on 09/02/2015.  - PT/OT recommended SNF/ALF placement, but patient declined.  Admission Diagnosis  EKG abnormalities [R94.31] ESRD on dialysis ([place]) [N18.6, Z99.2] Troponin I above reference range [R79.89] Generalized weakness [R53.1]   Discharge Diagnosis  EKG abnormalities [R94.31] ESRD on dialysis ([place]) [N18.6, Z99.2] Troponin I above reference range [R79.89] Generalized weakness [R53.1]   Principal Problem:   Volume overload Active Problems:   CKD (chronic kidney disease) stage V requiring chronic dialysis (HCC)   HTN (hypertension)   History of pacemaker   History of bladder cancer   Presence of urostomy (HCC)   Anemia in chronic kidney disease (CKD)   Elevated troponin   Acute respiratory distress (HCC)   Thrombocytopenia ([place])   Fall   ESRD on dialysis [facility])      Past Medical History  Diagnosis Date  . Chronic kidney disease   . Hypertension   . Anemia   . Pacemaker   . Cancer ([place])     bladder -   . Hyperlipidemia   . Nephrolithiasis   . Atrial fibrillation [facility])     Past Surgical History  Procedure Laterality Date  . Tonsillectomy    . Bladder removal    . Permanent pacemaker insertion    . Revision of scar on face/head      due to wound   . Av fistula placement Right 10/01/2014    Procedure: INSERTION OF ARTERIOVENOUS (AV) GORE-TEX GRAFT RIGHT ARM;  Surgeon: Elam Dutch, MD;   Location: [place];  Service: Vascular;  Laterality: Right;  . Insert / replace / remove pacemaker    . Cholecystectomy    . Appendectomy    . Tonsillectomy         History of present illness and  Hospital Course:     Kindly see H&P for history of present illness and admission details, please review complete Labs, Consult reports and Test reports for all details in brief  HPI  from the history and physical done on the day of admission 08/26/2015  HPI: Robert Morgan is a 82 y.o. male with medical history significant for chronic kidney disease on dialysis Monday Wednesday Friday in [place], hypertension, anemia of chronic kidney disease, chronic, cytopenia, recurrent falls, history of pacemaker, history of bladder cancer with urostomy. Patient lives alone and attends adult daycare. Apparently has had several falls this week. He is missed dialysis at least one session. After one fall EMS was called to the home but patient refused transport. This morning adult daycare came to check  on him and found him to be weak with a wet cough and apparent dyspnea while seated. He was sent to the ER for evaluation.  ED Course:  Afebrile-BP 130/56, pulse 71, respirations 19, O2 saturations 100% on 4 L oxygen; weight 171 pounds (77.5 kg) with dry weight documented at 73.5 kg PCXR: Slight bibasilar atelectasis without edema or consolidation Lab data: Sodium 141, potassium 4.3, BUN 83, creatinine 9.57, glucose 136, albumin 3.2, total protein 6.2, BNP 1923, troponin 2.73, lactic acid 1.5, WBC 7000 with normal differential, hemoglobin 10.8, MCV 106.3, platelets 116,000, coags normal, the cultures obtained in ER DuoNeb 1 Levaquin 500 mg IV 1   Hospital Course  82 y.o. male with a Past Medical History of HTN, COPD, anemia, pacemaker, bladder cancer, HLD, A. fib , ESRD on HD who presents with physical deconditioning and fluid overload likely from missed dialysis, Scheduled patient PCP, poor chest x-ray as an  outpatient significant for pneumonia, but patient afebrile, no leukocytosis, repeat chest x-ray with no evidence of opacity during hospital stay.  Acute respiratory distress /Volume overload -Patient has nonproductive cough without fever, chills, leukocytosis or infiltrate on chest x-ray so doubt pneumonia that is bacterial in etiology although could have viral syndrome versus volume overload from missing hemodialysis. - Significant improvement after hemodialysis , no further respiratory distress.   ESRD -Dialyzes in [place] on Monday Wednesday Friday    Elevated troponin - Denies any chest pain or shortness of breath -Troponin trending down 2.73>2.83>2.06>1.78, elevation most likely to ESRD - 2-D echo with EF 45-50%, regional wall motion abnormality including hypokinesis of apical septal, apical lateral and apical myocardium, however with poor endocardial border definition  and this could be overestimated, discussed with patient PCP of record Dr. Cruzita Lederer, most recent echo in their system is in 2012, with EF 35-40%, with poor acoustic window and left ventricle which could not evaluate for wall motion abnormalities, as this point I don't see any further need for inpatient workup, this could be followed as an outpatient with his primary cardiologist, and an appointment with [facility] cardiology on 5/16 to see if further workup is indicated, as well patient missed his pacemaker interrogation on 5/9. - on telemetry, continue to have paced rhythm   HTN (hypertension) -Susceptible, continue to monitor   Anemia in chronic kidney disease (CKD) -Continue preadmission Aranesp   Thrombocytopenia  -Chronic issue and platelets are stable    Fall  - Suspect mechanical fall , patient denies any syncope - PT/OT consulted, they recommended SNF/ALF placement, but patient is adamant about going home. - Patient with fairly Lewys body dementia as discussion with PCP dr Marco Collie   History of  pacemaker secondary to heart block -100% ventricular paced   History of bladder cancer/Presence of urostomy  -Likely has chronic abnormal urinalysis given his ESRD , urine culture is not helpful as ago with multiple species   Atrial fibrillation - Has paced rhythm, not candidate for anticoagulation given multiple falls, and tinea with aspirin and beta blockers  Discharge Condition:  Stable   Follow UP  Follow-up Information    Follow up with Leota Jacobsen, MD.   Specialty:  Family Medicine   Contact information:   [place] [address] [phone]       Follow up with Donald Prose, PA-C.   Specialty:  Cardiology   Why:  on 5/16 at 2:30 PM   Contact information:   [address] [place]  [address] [phone]       Follow up with [facility], MD.   Specialty:  Family Medicine   Contact information:   [place] [address] [phone]         Discharge Instructions  and  Discharge Medications     Discharge Instructions    Discharge instructions    Complete by:  As directed   Follow with Primary MD Leota Jacobsen, MD in 7 days   Get CBC, CMP,  checked  by Primary MD next visit.    Activity: As tolerated with Full fall precautions use walker/cane & assistance as needed   Disposition Home    Diet: Heart Healthy, renal modified with 1200 mL fluid restriction  , with feeding assistance and aspiration precautions.  For Heart failure patients - Check your Weight same time everyday, if you gain over 2 pounds, or you develop in leg swelling, experience more shortness of breath or chest pain, call your Primary MD immediately. Follow Cardiac Low Salt Diet and 1.5 lit/day fluid restriction.   On your next visit with your primary care physician please Get Medicines reviewed and adjusted.   Please request your Prim.MD to go over all Hospital Tests and Procedure/Radiological results at the follow up,  please get all Hospital records sent to your Prim MD by signing hospital release before you go home.   If you experience worsening of your admission symptoms, develop shortness of breath, life threatening emergency, suicidal or homicidal thoughts you must seek medical attention immediately by calling 911 or calling your MD immediately  if symptoms less severe.  You Must read complete instructions/literature along with all the possible adverse reactions/side effects for all the Medicines you take and that have been prescribed to you. Take any new Medicines after you have completely understood and accpet all the possible adverse reactions/side effects.   Do not drive, operating heavy machinery, perform activities at heights, swimming or participation in water activities or provide baby sitting services if your were admitted for syncope or siezures until you have seen by Primary MD or a Neurologist and advised to do so again.  Do not drive when taking Pain medications.    Do not take more than prescribed Pain, Sleep and Anxiety Medications  Special Instructions: If you have smoked or chewed Tobacco  in the last 2 yrs please stop smoking, stop any regular Alcohol  and or any Recreational drug use.  Wear Seat belts while driving.   Please note  You were cared for by a hospitalist during your hospital stay. If you have any questions about your discharge medications or the care you received while you were in the hospital after you are discharged, you can call the unit and asked to speak with the hospitalist on call if the hospitalist that took care of you is not available. Once you are discharged, your primary care physician will handle any further medical issues. Please note that NO REFILLS for any discharge medications will be authorized once you are discharged, as it is imperative that you return to your primary care physician (or establish a relationship with a primary care physician if you do not  have one) for your aftercare needs so that they can reassess your need for medications and monitor your lab values.            Medication List    TAKE these medications        acetaminophen 325 MG tablet  Commonly known as:  TYLENOL  Take 650 mg by mouth every 6 (six) hours as needed for mild pain.     aspirin EC 81 MG tablet  Take 81 mg by mouth daily.     atorvastatin 10 MG tablet  Commonly known as:  LIPITOR  Take 10 mg by mouth daily.     calcitRIOL 0.25 MCG capsule  Commonly known as:  ROCALTROL  Take 0.25 mcg by mouth daily.     darbepoetin 60 MCG/0.3ML Soln injection  Commonly known as:  ARANESP  Inject 60 mcg into the skin every 7 (seven) days.     ENSURE  Take 237 mLs by mouth daily.     metoprolol tartrate 25 MG tablet  Commonly known as:  LOPRESSOR  Take 12.5 mg by mouth 2 (two) times daily.     multivitamin Tabs tablet  Take 1 tablet by mouth at bedtime.     omeprazole 40 MG capsule  Commonly known as:  PRILOSEC  Take 40 mg by mouth daily.     sevelamer carbonate 800 MG tablet  Commonly known as:  RENVELA  Take 1,600 mg by mouth 3 (three) times daily with meals.     temazepam 15 MG capsule  Commonly known as:  RESTORIL  Take 15 mg by mouth at bedtime as needed for sleep.     triamcinolone cream 0.1 %  Commonly known as:  KENALOG  Apply 1 application topically 2 (two) times daily.          Diet and Activity recommendation: See Discharge Instructions above   Consults obtained -  none   Major procedures and Radiology Reports - PLEASE review detailed and final reports for all details, in brief -     *[facility]*  [address], [place] [address]  [phone]  ------------------------------------------------------------------- Transthoracic Echocardiography  Patient:  Robert Morgan, Robert Morgan MR #: [id number] Study Date: 08/27/2015 Gender: M Age: 66 Height: 165.1 cm Weight: 69.1 kg BSA: 1.79 m^2 Pt. Status: Room: 6E16C  Robert Morgan REFERRING Robert Morgan ATTENDING Robert Morgan ADMITTING Waldemar Dickens PERFORMING [facility], Inpatient SONOGRAPHER Roseanna Rainbow  cc:  ------------------------------------------------------------------- LV EF: 45% - 50%  ------------------------------------------------------------------- Indications: Syncope 780.2.  ------------------------------------------------------------------- History: PMH: Elevated troponin. ESRD. CKD. Risk factors: Hypertension.  ------------------------------------------------------------------- Study Conclusions  - Left ventricle: The cavity size was normal. Systolic function was  mildly reduced. The estimated ejection fraction was in the range  of 45% to 50%. Left ventricular diastolic function parameters  were normal. Doppler parameters are consistent with intermittent  ventricular filling pressure. - Regional wall motion abnormality: Therre appears to be  hypokinesis of the apical septal, apical lateral, and apical  myocardium. However, endocardial border definition is poor and  this could be overestimated. - Aortic valve: Transvalvular velocity was within the normal range.  There was no stenosis. There was no regurgitation. - Mitral valve: Calcified annulus. Mobility of the posterior  leaflet was restricted. Transvalvular velocity was within the  normal range. There was no evidence for stenosis. There was no  regurgitation. - Left atrium: The atrium was mildly dilated. - Right ventricle: The cavity size was normal. Wall thickness was  normal. Systolic function was normal. - Atrial septum: No defect or patent foramen ovale was identified. - Tricuspid valve: There was mild  regurgitation. - Pulmonary arteries: Systolic pressure was mildly to moderately  increased. PA peak pressure: 47 mm Hg (Morgan). - Inferior vena cava: The vessel was dilated.  The respirophasic  diameter changes were blunted (< 50%), consistent with elevated  central venous pressure. - Pericardium, extracardiac: There was a left pleural effusion.   X-ray Chest Pa And Lateral  08/26/2015  CLINICAL DATA:  82 year old male with shortness of breath EXAM: CHEST  2 VIEW COMPARISON:  Chest radiograph dated 08/26/2015 08/25/2015 FINDINGS: Two views of the chest do not demonstrate a focal consolidation. There is minimal bibasilar atelectatic changes. There is no pleural effusion or pneumothorax. Evaluation of the right apical region is limited due to superimposition of the patient'Morgan mandibular soft tissues. The cardiac silhouette is within normal limits. Left pectoral pacemaker device. No acute osseous pathology IMPRESSION: No active cardiopulmonary disease. Electronically Signed   By: Anner Crete M.D.   On: 08/26/2015 18:43   Dg Chest Port 1 View  08/26/2015  CLINICAL DATA:  Cough and wheezing.  Chronic renal failure EXAM: PORTABLE CHEST 1 VIEW COMPARISON:  Aug 25, 2015 FINDINGS: There is mild bibasilar atelectasis. No edema or consolidation. Heart is upper normal in size with pulmonary vascularity within normal limits. Pacemaker leads are attached to the right atrium and right ventricle. No pneumothorax. Aorta is tortuous but stable. No adenopathy. IMPRESSION: Slight bibasilar atelectasis. No edema or consolidation. Stable cardiac silhouette. Electronically Signed   By: Lowella Grip III M.D.   On: 08/26/2015 10:27    Micro Results     Recent Results (from the past 240 hour(Morgan))  Culture, blood (routine x 2)     Status: None (Preliminary result)   Collection Time: 08/26/15 10:04 AM  Result Value Ref Range Status   Specimen Description BLOOD LEFT FOREARM  Final   Special Requests IN PEDIATRIC  BOTTLE 3CC  Final   Culture NO GROWTH 1 DAY  Final   Report Status PENDING  Incomplete  Culture, blood (routine x 2)     Status: None (Preliminary result)   Collection Time: 08/26/15 10:10 AM  Result Value Ref Range Status   Specimen Description BLOOD LEFT ANTECUBITAL  Final   Special Requests BOTTLES DRAWN AEROBIC ONLY 5CC  Final   Culture NO GROWTH 1 DAY  Final   Report Status PENDING  Incomplete  MRSA PCR Screening     Status: None   Collection Time: 08/26/15  5:27 PM  Result Value Ref Range Status   MRSA by PCR NEGATIVE NEGATIVE Final    Comment:        The GeneXpert MRSA Assay (FDA approved for NASAL specimens only), is one component of a comprehensive MRSA colonization surveillance program. It is not intended to diagnose MRSA infection nor to guide or monitor treatment for MRSA infections.   Urine culture     Status: Abnormal   Collection Time: 08/26/15  5:56 PM  Result Value Ref Range Status   Specimen Description URINE, RANDOM  Final   Special Requests NONE  Final   Culture MULTIPLE SPECIES PRESENT, SUGGEST RECOLLECTION (A)  Final   Report Status 08/27/2015 FINAL  Final       Today   Subjective:   Robert Morgan today has no headache,no chest abdominal pain,no new weakness tingling or numbness, feels much better wants to go home today.   Objective:   Blood pressure 116/58, pulse 80, temperature 98.6 F (37 C), temperature source Oral, resp. rate 18, height 5\' 5"  (1.651 m), weight 69.1 kg (152 lb 5.4 oz), SpO2 97 %.   Intake/Output Summary (Last 24 hours) at 08/28/15 1349 Last data filed at 08/28/15 0900  Gross per 24  hour  Intake    720 ml  Output     25 ml  Net    695 ml    Exam Awake Alert, Oriented x 3, No new F.N deficits, Normal affect [place].AT,PERRAL Supple Neck,No JVD, No cervical lymphadenopathy appriciated.  Symmetrical Chest wall movement, Good air movement bilaterally, CTAB RRR,No Gallops,Rubs , No Parasternal Heave +ve B.Sounds, Abd Soft,  Non tender, No organomegaly appriciated, No rebound -guarding or rigidity. No Cyanosis, Clubbing or edema, No new Rash or bruise  Data Review   CBC w Diff: Lab Results  Component Value Date   WBC 4.8 08/27/2015   HGB 10.2* 08/27/2015   HCT 31.6* 08/27/2015   PLT 106* 08/27/2015   LYMPHOPCT 14 08/26/2015   MONOPCT 8 08/26/2015   EOSPCT 0 08/26/2015   BASOPCT 0 08/26/2015    CMP: Lab Results  Component Value Date   NA 142 08/27/2015   K 3.8 08/27/2015   CL 99* 08/27/2015   CO2 22 08/27/2015   BUN 94* 08/27/2015   CREATININE 10.60* 08/27/2015   PROT 6.2* 08/27/2015   ALBUMIN 2.9* 08/27/2015   BILITOT 0.9 08/27/2015   ALKPHOS 82 08/27/2015   AST 11* 08/27/2015   ALT 19 08/27/2015  .   Total Time in preparing paper work, data evaluation and todays exam - 35 minutes  Zeshan Sena M.D on 08/28/2015 at 1:49 PM  [facility]   Office  [phone]

## 2015-08-28 NOTE — Progress Notes (Signed)
Patient with no IV in place upon my initial assessment at 0715. Dr Waldron Labs paged and states it is okay for patient not to have an IV access at this time.

## 2015-08-28 NOTE — Progress Notes (Signed)
Pt discharged home in care of [facility]/neighbor/friend. Pt has [facility] services and retirement community staff will see in am.

## 2015-08-28 NOTE — Progress Notes (Signed)
Robert Morgan [phone], patients friend to pick him up and take him home at 1730 today.

## 2015-08-28 NOTE — Clinical Social Work Note (Addendum)
Patient will discharge home today. He is a participant with [facility] and Robert Morgan [phone]) with [facility] contacted CSW on 08/27/15 to advise that they are involved. Per Robert Morgan, patient's wife is in a facility and the family is not very involved. Robert Morgan has been in their program since 01/18/15 and his wife (in a skilled facility) has been with them since 10/17/13. Ms. Robert Morgan advised on 5/11 that patient discharging home and transportation arranged by patient for a neighbor to pick him up.  Call made to patient's daughter, Robert Morgan [phone]) and left message with ex-wife Robert Morgan to inform daughter that her family member is discharging home today. CSW signing off.  Irby Fails Givens, MSW, LCSW Licensed Clinical Social Worker [place] [phone]

## 2015-08-28 NOTE — Discharge Instructions (Signed)
Follow with Primary MD Dineen Kid D, MD in 7 days   Get CBC, CMP,  checked  by Primary MD next visit.    Activity: As tolerated with Full fall precautions use walker/cane & assistance as needed   Disposition Home    Diet: Heart Healthy, renal modified with 1200 mL fluid restriction  , with feeding assistance and aspiration precautions.  For Heart failure patients - Check your Weight same time everyday, if you gain over 2 pounds, or you develop in leg swelling, experience more shortness of breath or chest pain, call your Primary MD immediately. Follow Cardiac Low Salt Diet and 1.5 lit/day fluid restriction.   On your next visit with your primary care physician please Get Medicines reviewed and adjusted.   Please request your Prim.MD to go over all Hospital Tests and Procedure/Radiological results at the follow up, please get all Hospital records sent to your Prim MD by signing hospital release before you go home.   If you experience worsening of your admission symptoms, develop shortness of breath, life threatening emergency, suicidal or homicidal thoughts you must seek medical attention immediately by calling 911 or calling your MD immediately  if symptoms less severe.  You Must read complete instructions/literature along with all the possible adverse reactions/side effects for all the Medicines you take and that have been prescribed to you. Take any new Medicines after you have completely understood and accpet all the possible adverse reactions/side effects.   Do not drive, operating heavy machinery, perform activities at heights, swimming or participation in water activities or provide baby sitting services if your were admitted for syncope or siezures until you have seen by Primary MD or a Neurologist and advised to do so again.  Do not drive when taking Pain medications.    Do not take more than prescribed Pain, Sleep and Anxiety Medications  Special Instructions: If you have  smoked or chewed Tobacco  in the last 2 yrs please stop smoking, stop any regular Alcohol  and or any Recreational drug use.  Wear Seat belts while driving.   Please note  You were cared for by a hospitalist during your hospital stay. If you have any questions about your discharge medications or the care you received while you were in the hospital after you are discharged, you can call the unit and asked to speak with the hospitalist on call if the hospitalist that took care of you is not available. Once you are discharged, your primary care physician will handle any further medical issues. Please note that NO REFILLS for any discharge medications will be authorized once you are discharged, as it is imperative that you return to your primary care physician (or establish a relationship with a primary care physician if you do not have one) for your aftercare needs so that they can reassess your need for medications and monitor your lab values.

## 2015-08-28 NOTE — Care Management Note (Signed)
Case Management Note  Patient Details  Name: JABRILL PAYANT MRN: [id number] Date of Birth: [birth year]  Subjective/Objective:             Admitted with volume overload        Action/Plan: Patient is in [facility] program. [facility], spoke with Sharion Balloon [phone], patient has an aide 2hrs per day through [facility]. No home health ordered by MD. Patient has transportation to home.      Expected Discharge Date:  08/27/15               Expected Discharge Plan:  Home/Self Care  In-House Referral:  Clinical Social Work  Discharge planning Services  CM Consult  Post Acute Care Choice:    Choice offered to:  NA  DME Arranged:  N/A DME Agency:     HH Arranged:  NA HH Agency:     Status of Service:  Completed, signed off  Medicare Important Message Given:    Date Medicare IM Given:    Medicare IM give by:    Date Additional Medicare IM Given:    Additional Medicare Important Message give by:     If discussed at [place] of Stay Meetings, dates discussed:    Additional Comments:  Nila Nephew, RN 08/28/2015, 2:43 PM

## 2015-08-28 NOTE — Clinical Social Work Note (Deleted)
PT/OT evaluated patient and recommended SNF or 24 supervision/assistance. Patient indicated that he is going home. He will be picked up by a friend and transported home. Robert Morgan with [facility] updated.  CSW signing off.   Addilynn Mowrer Givens, MSW, LCSW Licensed Clinical Social Worker [place] [phone]

## 2015-08-31 LAB — CULTURE, BLOOD (ROUTINE X 2)
Culture: NO GROWTH
Culture: NO GROWTH

## 2016-01-05 ENCOUNTER — Other Ambulatory Visit: Payer: Self-pay | Admitting: Radiology

## 2016-01-05 ENCOUNTER — Other Ambulatory Visit: Payer: Self-pay | Admitting: General Surgery

## 2016-01-05 ENCOUNTER — Other Ambulatory Visit (HOSPITAL_COMMUNITY): Payer: Self-pay | Admitting: Nephrology

## 2016-01-05 DIAGNOSIS — N186 End stage renal disease: Secondary | ICD-10-CM

## 2016-01-06 ENCOUNTER — Other Ambulatory Visit: Payer: Self-pay | Admitting: Radiology

## 2016-01-06 ENCOUNTER — Encounter (HOSPITAL_COMMUNITY): Payer: Self-pay

## 2016-01-06 ENCOUNTER — Ambulatory Visit (HOSPITAL_COMMUNITY)
Admission: RE | Admit: 2016-01-06 | Discharge: 2016-01-06 | Disposition: A | Discharge status: Home / Self Care | Payer: Medicare (Managed Care) | Source: Ambulatory Visit | Attending: Nephrology | Admitting: Nephrology

## 2016-01-06 ENCOUNTER — Other Ambulatory Visit (HOSPITAL_COMMUNITY): Payer: Self-pay | Admitting: Nephrology

## 2016-01-06 DIAGNOSIS — Z950 Presence of cardiac pacemaker: Secondary | ICD-10-CM | POA: Diagnosis not present

## 2016-01-06 DIAGNOSIS — Z8551 Personal history of malignant neoplasm of bladder: Secondary | ICD-10-CM | POA: Insufficient documentation

## 2016-01-06 DIAGNOSIS — Z8249 Family history of ischemic heart disease and other diseases of the circulatory system: Secondary | ICD-10-CM | POA: Diagnosis not present

## 2016-01-06 DIAGNOSIS — Z823 Family history of stroke: Secondary | ICD-10-CM | POA: Insufficient documentation

## 2016-01-06 DIAGNOSIS — Z7982 Long term (current) use of aspirin: Secondary | ICD-10-CM | POA: Diagnosis not present

## 2016-01-06 DIAGNOSIS — Z992 Dependence on renal dialysis: Secondary | ICD-10-CM | POA: Diagnosis not present

## 2016-01-06 DIAGNOSIS — N186 End stage renal disease: Secondary | ICD-10-CM | POA: Insufficient documentation

## 2016-01-06 DIAGNOSIS — Y832 Surgical operation with anastomosis, bypass or graft as the cause of abnormal reaction of the patient, or of later complication, without mention of misadventure at the time of the procedure: Secondary | ICD-10-CM | POA: Diagnosis not present

## 2016-01-06 DIAGNOSIS — I120 Hypertensive chronic kidney disease with stage 5 chronic kidney disease or end stage renal disease: Secondary | ICD-10-CM | POA: Diagnosis not present

## 2016-01-06 DIAGNOSIS — I4891 Unspecified atrial fibrillation: Secondary | ICD-10-CM | POA: Diagnosis not present

## 2016-01-06 DIAGNOSIS — Z87442 Personal history of urinary calculi: Secondary | ICD-10-CM | POA: Diagnosis not present

## 2016-01-06 DIAGNOSIS — E785 Hyperlipidemia, unspecified: Secondary | ICD-10-CM | POA: Insufficient documentation

## 2016-01-06 DIAGNOSIS — T82868A Thrombosis of vascular prosthetic devices, implants and grafts, initial encounter: Secondary | ICD-10-CM | POA: Insufficient documentation

## 2016-01-06 DIAGNOSIS — D649 Anemia, unspecified: Secondary | ICD-10-CM | POA: Diagnosis not present

## 2016-01-06 DIAGNOSIS — Z87891 Personal history of nicotine dependence: Secondary | ICD-10-CM | POA: Diagnosis not present

## 2016-01-06 HISTORY — PX: IR GENERIC HISTORICAL: IMG1180011

## 2016-01-06 LAB — BASIC METABOLIC PANEL
Anion gap: 12 (ref 5–15)
BUN: 66 mg/dL — ABNORMAL HIGH (ref 6–20)
CHLORIDE: 99 mmol/L — AB (ref 101–111)
CO2: 27 mmol/L (ref 22–32)
CREATININE: 8.51 mg/dL — AB (ref 0.61–1.24)
Calcium: 8.8 mg/dL — ABNORMAL LOW (ref 8.9–10.3)
GFR, EST AFRICAN AMERICAN: 6 mL/min — AB (ref >60)
GFR, EST NON AFRICAN AMERICAN: 5 mL/min — AB (ref >60)
Glucose, Bld: 108 mg/dL — ABNORMAL HIGH (ref 65–99)
POTASSIUM: 4.8 mmol/L (ref 3.5–5.1)
SODIUM: 138 mmol/L (ref 135–145)

## 2016-01-06 LAB — CBC
HCT: 32 % — ABNORMAL LOW (ref 39.0–52.0)
Hemoglobin: 10.3 g/dL — ABNORMAL LOW (ref 13.0–17.0)
MCH: 33.8 pg (ref 26.0–34.0)
MCHC: 32.2 g/dL (ref 30.0–36.0)
MCV: 104.9 fL — ABNORMAL HIGH (ref 78.0–100.0)
PLATELETS: 96 10*3/uL — AB (ref 150–400)
RBC: 3.05 MIL/uL — ABNORMAL LOW (ref 4.22–5.81)
RDW: 16.2 % — ABNORMAL HIGH (ref 11.5–15.5)
WBC: 5.5 10*3/uL (ref 4.0–10.5)

## 2016-01-06 LAB — PROTIME-INR
INR: 1.09
Prothrombin Time: 14.1 seconds (ref 11.4–15.2)

## 2016-01-06 LAB — APTT: APTT: 32 s (ref 24–36)

## 2016-01-06 MED ORDER — SODIUM CHLORIDE 0.9 % IV SOLN
INTRAVENOUS | Status: AC | PRN
  Administered 2016-01-06: 10 mL/h via INTRAVENOUS

## 2016-01-06 MED ORDER — LIDOCAINE HCL 1 % IJ SOLN
INTRAMUSCULAR | Status: AC | PRN
  Administered 2016-01-06: 10 mL

## 2016-01-06 MED ORDER — MIDAZOLAM HCL 2 MG/2ML IJ SOLN
INTRAMUSCULAR | Status: AC
  Filled 2016-01-06: qty 2

## 2016-01-06 MED ORDER — HEPARIN SODIUM (PORCINE) 1000 UNIT/ML IJ SOLN
INTRAMUSCULAR | Status: AC | PRN
  Administered 2016-01-06: 3000 [IU] via INTRAVENOUS

## 2016-01-06 MED ORDER — FENTANYL CITRATE (PF) 100 MCG/2ML IJ SOLN
INTRAMUSCULAR | Status: AC | PRN
  Administered 2016-01-06: 25 ug via INTRAVENOUS

## 2016-01-06 MED ORDER — SODIUM CHLORIDE 0.9 % IV SOLN: Freq: Once | INTRAVENOUS | Status: DC

## 2016-01-06 MED ORDER — FENTANYL CITRATE (PF) 100 MCG/2ML IJ SOLN
INTRAMUSCULAR | Status: AC
  Filled 2016-01-06: qty 2

## 2016-01-06 MED ORDER — MIDAZOLAM HCL 2 MG/2ML IJ SOLN
INTRAMUSCULAR | Status: AC | PRN
  Administered 2016-01-06: 1 mg via INTRAVENOUS

## 2016-01-06 MED ORDER — ALTEPLASE 2 MG IJ SOLR
INTRAMUSCULAR | Status: AC
  Filled 2016-01-06: qty 2

## 2016-01-06 MED ORDER — ALTEPLASE 2 MG IJ SOLR
INTRAMUSCULAR | Status: AC | PRN
  Administered 2016-01-06: 2 mg

## 2016-01-06 MED ORDER — HEPARIN SODIUM (PORCINE) 1000 UNIT/ML IJ SOLN
INTRAMUSCULAR | Status: AC
  Filled 2016-01-06: qty 1

## 2016-01-06 MED ORDER — LIDOCAINE HCL 1 % IJ SOLN
INTRAMUSCULAR | Status: AC
  Filled 2016-01-06: qty 20

## 2016-01-06 MED ORDER — IOPAMIDOL (ISOVUE-300) INJECTION 61%
INTRAVENOUS | Status: AC
  Administered 2016-01-06: 50 mL
  Filled 2016-01-06: qty 100

## 2016-01-06 NOTE — Discharge Instructions (Signed)
Fistulogram, Care After °Refer to this sheet in the next few weeks. These instructions provide you with information on caring for yourself after your procedure. Your health care provider may also give you more specific instructions. Your treatment has been planned according to current medical practices, but problems sometimes occur. Call your health care provider if you have any problems or questions after your procedure. °WHAT TO EXPECT AFTER THE PROCEDURE °After your procedure, it is typical to have the following: °· A small amount of discomfort in the area where the catheters were placed. °· A small amount of bruising around the fistula. °· Sleepiness and fatigue. °HOME CARE INSTRUCTIONS °· Rest at home for the day following your procedure. °· Do not drive or operate heavy machinery while taking pain medicine. °· Take medicines only as directed by your health care provider. °· Do not take baths, swim, or use a hot tub until your health care provider approves. You may shower 24 hours after the procedure or as directed by your health care provider. °· There are many different ways to close and cover an incision, including stitches, skin glue, and adhesive strips. Follow your health care provider's instructions on: °¨ Incision care. °¨ Bandage (dressing) changes and removal. °¨ Incision closure removal. °· Monitor your dialysis fistula carefully. °SEEK MEDICAL CARE IF: °· You have drainage, redness, swelling, or pain at your catheter site. °· You have a fever. °· You have chills. °SEEK IMMEDIATE MEDICAL CARE IF: °· You feel weak. °· You have trouble balancing. °· You have trouble moving your arms or legs. °· You have problems with your speech or vision. °· You can no longer feel a vibration or buzz when you put your fingers over your dialysis fistula. °· The limb that was used for the procedure: °¨ Swells. °¨ Is painful. °¨ Is cold. °¨ Is discolored, such as blue or pale white. °  °This information is not intended  to replace advice given to you by your health care provider. Make sure you discuss any questions you have with your health care provider. °  °Document Released: 08/20/2013 Document Reviewed: 08/20/2013 °Elsevier Interactive Patient Education ©2016 [employer] Inc[employer] ° °

## 2016-01-06 NOTE — H&P (Signed)
Chief Complaint: Patient was seen in consultation today for RUA dialysis graft thrombolysis with possible angioplasty/stent. Possible HD catheter if needed at the request of [place]  Referring Physician(s): Upton,Elizabeth  Supervising Physician: Marybelle Killings  Patient Status: Outpatient  History of Present Illness: Robert Morgan is a 82 y.o. male   ESRD RUA dialysis graft Last use Fri 9/15 without issue Mon was clotted No dialysis since then  Hx declot on this same graft 07/24/15 IMPRESSION: Successful right arm AV graft thrombectomy and dilatation of the venous anastomosis to 7 mm.  Pt now scheduled for same   Past Medical History:  Diagnosis Date  . Anemia   . Atrial fibrillation ([place])   . Cancer ([place])    bladder -   . Chronic kidney disease   . Hyperlipidemia   . Hypertension   . Nephrolithiasis   . Pacemaker     Past Surgical History:  Procedure Laterality Date  . APPENDECTOMY    . AV FISTULA PLACEMENT Right 10/01/2014   Procedure: INSERTION OF ARTERIOVENOUS (AV) GORE-TEX GRAFT RIGHT ARM;  Surgeon: Elam Dutch, MD;  Location: [facility];  Service: Vascular;  Laterality: Right;  . BLADDER REMOVAL    . CHOLECYSTECTOMY    . INSERT / REPLACE / REMOVE PACEMAKER    . PERMANENT PACEMAKER INSERTION    . REVISION OF SCAR ON FACE/HEAD     due to wound   . TONSILLECTOMY    . TONSILLECTOMY      Allergies: Review of patient's allergies indicates no known allergies.  Medications: Prior to Admission medications   Medication Sig Start Date End Date Taking? Authorizing Provider  acetaminophen (TYLENOL) 325 MG tablet Take 650 mg by mouth every 6 (six) hours as needed for mild pain.    Historical Provider, MD  aspirin EC 81 MG tablet Take 81 mg by mouth daily.    Historical Provider, MD  atorvastatin (LIPITOR) 10 MG tablet Take 10 mg by mouth daily.    Historical Provider, MD  calcitRIOL (ROCALTROL) 0.25 MCG capsule Take 0.25 mcg by mouth daily.     Historical Provider, MD  darbepoetin (ARANESP) 60 MCG/0.3ML SOLN injection Inject 60 mcg into the skin every 7 (seven) days.    Historical Provider, MD  ENSURE (ENSURE) Take 237 mLs by mouth daily.    Historical Provider, MD  metoprolol tartrate (LOPRESSOR) 25 MG tablet Take 12.5 mg by mouth 2 (two) times daily.     Historical Provider, MD  multivitamin (RENA-VIT) TABS tablet Take 1 tablet by mouth at bedtime. 11/13/14   Maryann Mikhail, DO  omeprazole (PRILOSEC) 40 MG capsule Take 40 mg by mouth daily.    Historical Provider, MD  sevelamer carbonate (RENVELA) 800 MG tablet Take 1,600 mg by mouth 3 (three) times daily with meals.    Historical Provider, MD  temazepam (RESTORIL) 15 MG capsule Take 15 mg by mouth at bedtime as needed for sleep.    Historical Provider, MD  triamcinolone cream (KENALOG) 0.1 % Apply 1 application topically 2 (two) times daily.    Historical Provider, MD     Family History  Problem Relation Age of Onset  . Stroke Mother   . Hypertension Mother   . Varicose Veins Mother   . Heart disease Mother   . Heart attack Father     Social History   Social History  . Marital status: Married    Spouse name: N/A  . Number of children: N/A  . Years of education:  N/A   Social History Main Topics  . Smoking status: Former Smoker    Quit date: 04/19/1980  . Smokeless tobacco: None  . Alcohol use 0.0 oz/week  . Drug use: No  . Sexual activity: No   Other Topics Concern  . None   Social History Narrative  . None     Review of Systems: A 12 point ROS discussed and pertinent positives are indicated in the HPI above.  All other systems are negative.  Review of Systems  Constitutional: Negative for activity change, appetite change, fatigue and fever.  Respiratory: Negative for shortness of breath.   Cardiovascular: Negative for chest pain.  Musculoskeletal: Positive for gait problem.  Neurological: Positive for weakness.  Psychiatric/Behavioral: Negative for  behavioral problems and confusion.    Vital Signs: BP (!) 166/90   Pulse 71   Temp 97.7 F (36.5 C) (Oral)   Resp 18   Ht 5\' 5"  (1.651 m)   Wt 162 lb (73.5 kg)   SpO2 97%   BMI 26.96 kg/m   Physical Exam  Constitutional: He is oriented to person, place, and time.  Cardiovascular: Normal rate and regular rhythm.   Pulmonary/Chest: Effort normal and breath sounds normal. He has no wheezes.  Abdominal: Soft. Bowel sounds are normal.  Musculoskeletal: Normal range of motion.  RUA graft in place +pulses distally No thrill palpated  Neurological: He is alert and oriented to person, place, and time.  Skin: Skin is warm and dry.  Psychiatric: He has a normal mood and affect. His behavior is normal. Judgment normal.  Nursing note and vitals reviewed.   Mallampati Score:  MD Evaluation Airway: WNL Heart: WNL Abdomen: WNL Chest/ Lungs: WNL ASA  Classification: 3 Mallampati/Airway Score: Two  Imaging: No results found.  Labs:  CBC:  Recent Labs  05/22/15 1108 08/26/15 1004 08/27/15 0822  WBC 4.5 7.0 4.8  HGB 11.0* 10.8* 10.2*  HCT 33.4* 33.9* 31.6*  PLT 115* 116* 106*    COAGS:  Recent Labs  05/22/15 1108 08/26/15 1004  INR 1.00 1.20  APTT 31  --     BMP:  Recent Labs  07/24/15 1236 08/26/15 1004 08/27/15 0822 08/27/15 0823  NA 142 141 142 142  K 4.8 4.3 3.8 3.8  CL 97* 99* 99* 99*  CO2 29 23 23 22   GLUCOSE 128* 136* 117* 112*  BUN 57* 83* 94* 94*  CALCIUM 8.7* 8.6* 8.2* 8.1*  CREATININE 7.45* 9.57* 10.68* 10.60*  GFRNONAA 6* 4* 4* 4*  GFRAA 7* 5* 4* 4*    LIVER FUNCTION TESTS:  Recent Labs  08/26/15 1004 08/27/15 0822 08/27/15 0823  BILITOT 0.9 0.9  --   AST 15 11*  --   ALT 20 19  --   ALKPHOS 91 82  --   PROT 6.2* 6.2*  --   ALBUMIN 3.2* 2.9* 2.9*    TUMOR MARKERS: No results for input(s): AFPTM, CEA, CA199, CHROMGRNA in the last 8760 hours.  Assessment and Plan:  Right upper arm dialysis graft clotted Scheduled for  thrombolysis with poss pta/stent placement Possible hemodialysis catheter placement Risks and Benefits discussed with the patient including, but not limited to bleeding, infection, vascular injury, pulmonary embolism, need for tunneled HD catheter placement or even death. All of the patient's questions were answered, patient is agreeable to proceed. Consent signed and in chart.   Thank you for this interesting consult.  I greatly enjoyed meeting Robert Morgan and look forward to participating in their  care.  A copy of this report was sent to the requesting provider on this date.  Electronically Signed: Georgana Romain A 01/06/2016, 9:19 AM   I spent a total of  30 Minutes   in face to face in clinical consultation, greater than 50% of which was counseling/coordinating care for RUA dialysis graft declot

## 2016-01-06 NOTE — Procedures (Signed)
RUE AVG declot PTA venous 7 mm No comp/EBL

## 2016-01-06 NOTE — Progress Notes (Signed)
Pt here for declot from nursing home, accompanied by caregiver. Awake and alert. In no distress. Skin tear to left elbow. Dressing changed, telfa and kerlix applied.

## 2016-03-15 ENCOUNTER — Observation Stay (HOSPITAL_COMMUNITY)
Admission: EM | Admit: 2016-03-15 | Discharge: 2016-03-18 | Disposition: A | Discharge status: Home / Self Care | Payer: Medicare (Managed Care) | Attending: Student in an Organized Health Care Education/Training Program | Admitting: Student in an Organized Health Care Education/Training Program

## 2016-03-15 ENCOUNTER — Observation Stay (HOSPITAL_COMMUNITY): Payer: Medicare (Managed Care)

## 2016-03-15 ENCOUNTER — Encounter (HOSPITAL_COMMUNITY): Payer: Self-pay | Admitting: Emergency Medicine

## 2016-03-15 ENCOUNTER — Emergency Department (HOSPITAL_COMMUNITY): Payer: Medicare (Managed Care)

## 2016-03-15 DIAGNOSIS — G20 Parkinson's disease: Secondary | ICD-10-CM

## 2016-03-15 DIAGNOSIS — Z992 Dependence on renal dialysis: Secondary | ICD-10-CM | POA: Diagnosis not present

## 2016-03-15 DIAGNOSIS — Z79899 Other long term (current) drug therapy: Secondary | ICD-10-CM | POA: Diagnosis not present

## 2016-03-15 DIAGNOSIS — Z7982 Long term (current) use of aspirin: Secondary | ICD-10-CM | POA: Diagnosis not present

## 2016-03-15 DIAGNOSIS — N186 End stage renal disease: Secondary | ICD-10-CM

## 2016-03-15 DIAGNOSIS — R55 Syncope and collapse: Principal | ICD-10-CM | POA: Insufficient documentation

## 2016-03-15 DIAGNOSIS — I120 Hypertensive chronic kidney disease with stage 5 chronic kidney disease or end stage renal disease: Secondary | ICD-10-CM | POA: Insufficient documentation

## 2016-03-15 DIAGNOSIS — R0602 Shortness of breath: Secondary | ICD-10-CM

## 2016-03-15 DIAGNOSIS — Z87891 Personal history of nicotine dependence: Secondary | ICD-10-CM | POA: Diagnosis not present

## 2016-03-15 DIAGNOSIS — Z8551 Personal history of malignant neoplasm of bladder: Secondary | ICD-10-CM | POA: Insufficient documentation

## 2016-03-15 DIAGNOSIS — Z950 Presence of cardiac pacemaker: Secondary | ICD-10-CM

## 2016-03-15 HISTORY — DX: End stage renal disease: N18.6

## 2016-03-15 HISTORY — DX: Personal history of urinary calculi: Z87.442

## 2016-03-15 HISTORY — DX: Dependence on renal dialysis: N18.6

## 2016-03-15 HISTORY — DX: Dependence on renal dialysis: Z99.2

## 2016-03-15 LAB — CBC WITH DIFFERENTIAL/PLATELET
BASOS PCT: 1 %
Basophils Absolute: 0 10*3/uL (ref 0.0–0.1)
EOS PCT: 4 %
Eosinophils Absolute: 0.2 10*3/uL (ref 0.0–0.7)
HEMATOCRIT: 34.5 % — AB (ref 39.0–52.0)
Hemoglobin: 11.3 g/dL — ABNORMAL LOW (ref 13.0–17.0)
LYMPHS PCT: 11 %
Lymphs Abs: 0.6 10*3/uL — ABNORMAL LOW (ref 0.7–4.0)
MCH: 33.4 pg (ref 26.0–34.0)
MCHC: 32.8 g/dL (ref 30.0–36.0)
MCV: 102.1 fL — AB (ref 78.0–100.0)
MONO ABS: 0.5 10*3/uL (ref 0.1–1.0)
MONOS PCT: 9 %
NEUTROS ABS: 4.4 10*3/uL (ref 1.7–7.7)
Neutrophils Relative %: 75 %
PLATELETS: 120 10*3/uL — AB (ref 150–400)
RBC: 3.38 MIL/uL — ABNORMAL LOW (ref 4.22–5.81)
RDW: 15.4 % (ref 11.5–15.5)
WBC: 5.8 10*3/uL (ref 4.0–10.5)

## 2016-03-15 LAB — BASIC METABOLIC PANEL
Anion gap: 12 (ref 5–15)
BUN: 54 mg/dL — ABNORMAL HIGH (ref 6–20)
CALCIUM: 8.5 mg/dL — AB (ref 8.9–10.3)
CO2: 30 mmol/L (ref 22–32)
CREATININE: 6.9 mg/dL — AB (ref 0.61–1.24)
Chloride: 95 mmol/L — ABNORMAL LOW (ref 101–111)
GFR calc Af Amer: 7 mL/min — ABNORMAL LOW (ref >60)
GFR, EST NON AFRICAN AMERICAN: 6 mL/min — AB (ref >60)
GLUCOSE: 147 mg/dL — AB (ref 65–99)
Potassium: 4.8 mmol/L (ref 3.5–5.1)
Sodium: 137 mmol/L (ref 135–145)

## 2016-03-15 LAB — MRSA PCR SCREENING: MRSA BY PCR: NEGATIVE

## 2016-03-15 LAB — I-STAT TROPONIN, ED
Troponin i, poc: 0.01 ng/mL (ref 0.00–0.08)
Troponin i, poc: 0.01 ng/mL (ref 0.00–0.08)

## 2016-03-15 LAB — CBG MONITORING, ED: Glucose-Capillary: 136 mg/dL — ABNORMAL HIGH (ref 65–99)

## 2016-03-15 MED ORDER — ACETAMINOPHEN 325 MG PO TABS: 650.0000 mg | ORAL_TABLET | Freq: Four times a day (QID) | ORAL | Status: DC | PRN

## 2016-03-15 MED ORDER — SODIUM CHLORIDE 0.9% FLUSH
3.0000 mL | Freq: Two times a day (BID) | INTRAVENOUS | Status: DC
  Administered 2016-03-15 – 2016-03-17 (×4): 3 mL via INTRAVENOUS

## 2016-03-15 MED ORDER — FAMOTIDINE 20 MG PO TABS
20.0000 mg | ORAL_TABLET | Freq: Every day | ORAL | Status: DC
  Administered 2016-03-16 – 2016-03-17 (×2): 20 mg via ORAL
  Filled 2016-03-15 (×2): qty 1

## 2016-03-15 MED ORDER — RENA-VITE PO TABS
1.0000 | ORAL_TABLET | Freq: Every day | ORAL | Status: DC
  Administered 2016-03-15 – 2016-03-16 (×2): 1 via ORAL
  Filled 2016-03-15 (×2): qty 1

## 2016-03-15 MED ORDER — SEVELAMER CARBONATE 800 MG PO TABS
1600.0000 mg | ORAL_TABLET | Freq: Three times a day (TID) | ORAL | Status: DC
  Administered 2016-03-16 – 2016-03-17 (×5): 1600 mg via ORAL
  Filled 2016-03-15 (×5): qty 2

## 2016-03-15 MED ORDER — SODIUM CHLORIDE 0.9 % IV BOLUS (SEPSIS)
500.0000 mL | Freq: Once | INTRAVENOUS | Status: AC
  Administered 2016-03-15: 500 mL via INTRAVENOUS

## 2016-03-15 MED ORDER — VITAMIN D 1000 UNITS PO TABS
1000.0000 [IU] | ORAL_TABLET | Freq: Every day | ORAL | Status: DC
  Administered 2016-03-16 – 2016-03-17 (×2): 1000 [IU] via ORAL
  Filled 2016-03-15 (×2): qty 1

## 2016-03-15 MED ORDER — TRAZODONE HCL 50 MG PO TABS
50.0000 mg | ORAL_TABLET | Freq: Every day | ORAL | Status: DC
  Administered 2016-03-15 – 2016-03-16 (×2): 50 mg via ORAL
  Filled 2016-03-15 (×2): qty 1

## 2016-03-15 MED ORDER — DILTIAZEM HCL ER COATED BEADS 120 MG PO CP24
120.0000 mg | ORAL_CAPSULE | Freq: Every day | ORAL | Status: DC
  Administered 2016-03-16 – 2016-03-17 (×2): 120 mg via ORAL
  Filled 2016-03-15 (×4): qty 1

## 2016-03-15 MED ORDER — BOOST PO LIQD
237.0000 mL | Freq: Every day | ORAL | Status: DC
  Administered 2016-03-16: 237 mL via ORAL
  Filled 2016-03-15 (×3): qty 237

## 2016-03-15 MED ORDER — HEPARIN SODIUM (PORCINE) 5000 UNIT/ML IJ SOLN
5000.0000 [IU] | Freq: Three times a day (TID) | INTRAMUSCULAR | Status: DC
  Administered 2016-03-15 – 2016-03-17 (×6): 5000 [IU] via SUBCUTANEOUS
  Filled 2016-03-15 (×5): qty 1

## 2016-03-15 MED ORDER — ATORVASTATIN CALCIUM 10 MG PO TABS
10.0000 mg | ORAL_TABLET | Freq: Every day | ORAL | Status: DC
  Administered 2016-03-15 – 2016-03-16 (×2): 10 mg via ORAL
  Filled 2016-03-15 (×2): qty 1

## 2016-03-15 MED ORDER — QUETIAPINE FUMARATE 50 MG PO TABS
50.0000 mg | ORAL_TABLET | Freq: Two times a day (BID) | ORAL | Status: DC
  Administered 2016-03-15 – 2016-03-17 (×4): 50 mg via ORAL
  Filled 2016-03-15 (×4): qty 1

## 2016-03-15 NOTE — H&P (Signed)
Date: 03/15/2016               Patient Name:  Robert Morgan MRN: [id number]  DOB: 09/30/1927 Age / Sex: 82 y.o., male   PCP: Leota Jacobsen, MD         Medical Service: Internal Medicine Teaching Service         Attending Physician: Dr. Axel Filler, MD    First Contact: Dr. Holley Raring Pager: [phone]  Second Contact: Dr. Ignacia Marvel Pager: [phone]       After Hours (After 5p/  First Contact Pager: [phone]  weekends / holidays): Second Contact Pager: [phone]   Chief Complaint: syncope  History of Present Illness: Robert Morgan is a 81 y.o. male with a h/o of AV block w/ pacemaker, PAF, ESRD on HD who presents with syncope while at HD.  Pt was reportedly at HD today when he had a syncopal event. He denies any symptoms prior to this event. He states that he thinks he "fell asleep" and then woke up to find "6 people standing over me." He denies any confusion or dizziness after the event. He reports feeling well in the ED w/o neurologic deficit.  He denies fever, chills, HA, CP, SOB, abd pain, changes to urination or bowel habits.  In the ED, he had an unchanged EKG, negative head CT, trops unchanged from baseline.  Meds: No current facility-administered medications for this encounter.    Current Outpatient Prescriptions  Medication Sig Dispense Refill  . acetaminophen (TYLENOL) 325 MG tablet Take 650 mg by mouth every 6 (six) hours as needed for mild pain.    Marland Kitchen atorvastatin (LIPITOR) 10 MG tablet Take 10 mg by mouth daily.    . Cholecalciferol 1000 units tablet Take 1,000 Units by mouth daily.    Marland Kitchen diltiazem (TIAZAC) 120 MG 24 hr capsule Take 120 mg by mouth daily.    Marland Kitchen ENSURE (ENSURE) Take 237 mLs by mouth daily.    . multivitamin (RENA-VIT) TABS tablet Take 1 tablet by mouth at bedtime. 30 tablet 0  . QUEtiapine (SEROQUEL) 50 MG tablet Take 50 mg by mouth 2 (two) times daily.    . ranitidine (ZANTAC) 150 MG tablet Take 150 mg by mouth daily.    .  sevelamer carbonate (RENVELA) 800 MG tablet Take 1,600 mg by mouth 3 (three) times daily with meals.    . traZODone (DESYREL) 50 MG tablet Take 50 mg by mouth at bedtime.    Marland Kitchen aspirin EC 81 MG tablet Take 81 mg by mouth daily.    . calcitRIOL (ROCALTROL) 0.25 MCG capsule Take 0.25 mcg by mouth daily.    . darbepoetin (ARANESP) 60 MCG/0.3ML SOLN injection Inject 60 mcg into the skin every 7 (seven) days.    . metoprolol tartrate (LOPRESSOR) 25 MG tablet Take 12.5 mg by mouth 2 (two) times daily.     Marland Kitchen omeprazole (PRILOSEC) 40 MG capsule Take 40 mg by mouth daily.    . temazepam (RESTORIL) 15 MG capsule Take 15 mg by mouth at bedtime as needed for sleep.    Marland Kitchen triamcinolone cream (KENALOG) 0.1 % Apply 1 application topically 2 (two) times daily.      (Not in a hospital admission) Allergies: Allergies as of 03/15/2016  . (No Known Allergies)   Past Medical History:  Diagnosis Date  . Anemia   . Atrial fibrillation ([place])   . Cancer ([place])    bladder -   .  Chronic kidney disease   . Hyperlipidemia   . Hypertension   . Nephrolithiasis   . Pacemaker    Family History: Family History  Problem Relation Age of Onset  . Stroke Mother   . Hypertension Mother   . Varicose Veins Mother   . Heart disease Mother   . Heart attack Father    Social History: Pt has remote h/o smoking. Denies drug use, denies alcohol. Pt lives in assisted living facility.  Review of Systems: A complete ROS was negative except as per HPI. Review of Systems  Constitutional: Negative for chills, fever and weight loss.  Eyes: Negative for blurred vision.  Respiratory: Negative for cough and shortness of breath.   Cardiovascular: Negative for chest pain and leg swelling.  Gastrointestinal: Negative for abdominal pain, constipation, diarrhea, nausea and vomiting.  Genitourinary: Negative for dysuria, frequency and urgency.  Musculoskeletal: Negative for myalgias.  Skin: Negative for rash.  Neurological:  Negative for dizziness, tremors and headaches.  Endo/Heme/Allergies: Negative for polydipsia.  Psychiatric/Behavioral: The patient is not nervous/anxious.     Physical Exam: Vitals:   03/15/16 1445 03/15/16 1530 03/15/16 1600 03/15/16 1615  BP: 143/67 154/69 149/63 139/98  Pulse: 71  78   Resp: 15     Temp:      TempSrc:      SpO2: 98%  95%    Physical Exam  Constitutional: He is oriented to person, place, and time. He appears well-developed and well-nourished. He is cooperative. No distress.  HENT:  Head: Normocephalic and atraumatic.  Right Ear: Hearing normal.  Left Ear: Hearing normal.  Nose: Nose normal.  Mouth/Throat: Mucous membranes are normal.  Cardiovascular: Normal rate, regular rhythm, S1 normal, S2 normal and intact distal pulses.  Exam reveals no gallop.   No murmur heard. Pulmonary/Chest: Effort normal and breath sounds normal. No respiratory distress. He has no wheezes. He has no rhonchi. He has no rales. He exhibits no tenderness.  Abdominal: Soft. Normal appearance and bowel sounds are normal. He exhibits no ascites. There is no hepatosplenomegaly. There is no tenderness.  Musculoskeletal: He exhibits edema (trace to 1+ BLE). He exhibits no tenderness.  Neurological: He is alert and oriented to person, place, and time. He has normal strength.  Skin: Skin is warm, dry and intact. He is not diaphoretic.  Psychiatric: He has a normal mood and affect. His speech is normal and behavior is normal.    Labs: CBC:  Recent Labs Lab 03/15/16 1336  WBC 5.8  NEUTROABS 4.4  HGB 11.3*  HCT 34.5*  MCV 102.1*  PLT 123456*   Basic Metabolic Panel:  Recent Labs Lab 03/15/16 1336  NA 137  K 4.8  CL 95*  CO2 30  GLUCOSE 147*  BUN 54*  CREATININE 6.90*  CALCIUM 8.5*   Cardiac Enzymes:  Recent Labs Lab 03/15/16 1349 03/15/16 1647  TROPIPOC 0.01 0.01   CBG: Lab Results  Component Value Date   HGBA1C 5.6 08/26/2015    Recent Labs Lab 03/15/16 1320    GLUCAP 136*   EKG: EKG Interpretation  Date/Time:  Monday March 15 2016 13:12:18 EST Ventricular Rate:  76 PR Interval:    QRS Duration: 171 QT Interval:  466 QTC Calculation: 524 R Axis:   -84 Text Interpretation:  Sinus rhythm IVCD, consider atypical RBBB Inferior infarct, acute Anterolateral infarct, old When compared to prior ECG, T waves are not upright in Leads V3,V4,V5 No STEMI Confirmed by TEGELER MD, [place] [phone]) on 03/15/2016 1:18:12 PM  Imaging: Ct Head Wo Contrast  Result Date: 03/15/2016 CLINICAL DATA:  Dialysis patient with syncope and confusion. EXAM: CT HEAD WITHOUT CONTRAST TECHNIQUE: Contiguous axial images were obtained from the base of the skull through the vertex without intravenous contrast. COMPARISON:  08/22/2014 CT head FINDINGS: BRAIN: There is sulcal and ventricular prominence consistent with superficial and central atrophy. No intraparenchymal hemorrhage, mass effect nor midline shift. There are patchy periventricular and subcortical white matter hypodensities consistent with chronic small vessel ischemic disease. No acute large vascular territory infarcts. No abnormal extra-axial fluid collections. Basal cisterns are patent. VASCULAR: Moderate calcific atherosclerosis of the carotid siphons. SKULL: No acute to skull fracture. No significant scalp soft tissue swelling. Stable small right parietal lytic focus of the skull possibly a small hemangioma. SINUSES/ORBITS: The mastoid air-cells are clear. The included paranasal sinuses are well-aerated.The included ocular globes and orbital contents are non-suspicious. Bilateral lens replacement. OTHER: None. IMPRESSION: 1. Generalized cerebral atrophy. 2. No acute intracranial abnormality. 3. Chronic small vessel ischemic disease of periventricular and subcortical white matter. Electronically Signed   By: Ashley Royalty M.D.   On: 03/15/2016 15:39   Assessment & Plan by Problem: Active Problems:   Syncope  Mr.  SHARROD BREACH is a 82 y.o. male with PAF, AV block and pacemaker, ESRD on HD who presents with syncope.  1) Syncope: Pt w/ pacemaker and h/o AV block and PAF. No prodrome and with full resolution of symptoms. Differential is high for cardiogenic given hx. No neurologic findings, head CT w/o acute event, denies orthostatic symptoms w/ stable BP, no signs of infection w/o leukocytosis or fever. Cariology to interrogate pacemaker. - admit to tele for obs - orthostatic VS in AM - BCx - CXR - UA - Repeat EKG in AM  2) ESRD on HD: only received 30 min of treatment today. Electrolytes stable with K of 4.8. Nephrology aware. - repeat BMP in AM  3) PAF: NSR and paced. BP currently stable. Continue home dilt XR 120mg  qD.  DVT PPx - heparin  Code Status - Full  Consults Placed - Cardiology, Nephrology  Dispo: Admit patient for Observation.  Signed: Holley Raring, MD 03/15/2016, 4:58 PM  Pager: [phone]

## 2016-03-15 NOTE — ED Notes (Addendum)
Patient accidentally removed his urostomy pouch and folded the adhesive sides against itself.  Patient states "That bag has urine from yesterday and today in it.  It hasn't been emptied in a while."  Patient's urostomy pouch changed.  Will collect urine sample from new pouch.

## 2016-03-15 NOTE — Progress Notes (Signed)
Right arm hemodialysis deaccessed x 2. One needle  Pulled at a time. Pressure held x 15 minutes each site. Gauze applied and secured with paper tape. Wannetta Sender made aware to continue to monitor site. Patient tolerated well. Blood pressure is 136/69. Pulse is 72.

## 2016-03-15 NOTE — Progress Notes (Signed)
Assessed graft site. No bleeding noted. Gauze intact. Positive thrill noted in graft.

## 2016-03-15 NOTE — ED Notes (Signed)
PT CBG was 136mg 

## 2016-03-15 NOTE — ED Triage Notes (Signed)
Per [facility] patient was 30 minutes into a 3 hour dialysis treatment when he became unresponsive for approximately 5 minutes.  Patient is alert and oriented at this time.  Patient states he felt normal before treatment and feels normal now.  Patient states he remembers starting the treatment and next thing he remembers is opening his eyes with one of the dialysis nurses standing over him.  Patient arrived with dialysis catheters in place, IV team called to deaccess.

## 2016-03-15 NOTE — Progress Notes (Signed)
Pt arrived to the unit via stretcher. Pt alert and orientated x4. Peripheral IV in Left AC present upon admission.   Skin is clean, dry and intact. Right upper arm fistula assessed no complications.   Urostomy bag was changed today prior to admission.   Pt admitted to unit, pt alert and orientated to the room.   Call light and phone within reach.   Pt denies any pain and no concerns voiced at this time.  Paulla Fore, RN

## 2016-03-15 NOTE — ED Provider Notes (Signed)
[facility] DEPT Provider Note   CSN: [id number] Arrival date & time: 03/15/16  1259     History   Chief Complaint Chief Complaint  Patient presents with  . Loss of Consciousness    HPI Robert Morgan is a 82 y.o. male.   Loss of Consciousness   This is a new problem. The current episode started less than 1 hour ago. The problem occurs rarely. The problem has been resolved. He lost consciousness for a period of 1 to 5 minutes. Associated with: dialysis. Pertinent negatives include abdominal pain, back pain, bowel incontinence, chest pain, diaphoresis, dizziness, fever, headaches, light-headedness, palpitations, seizures, slurred speech, visual change and vomiting. He has tried nothing for the symptoms. The treatment provided no relief.    Past Medical History:  Diagnosis Date  . Anemia   . Atrial fibrillation ([place])   . Cancer ([place])    bladder -   . Chronic kidney disease   . Hyperlipidemia   . Hypertension   . Nephrolithiasis   . Pacemaker     Patient Active Problem List   Diagnosis Date Noted  . Syncope 03/15/2016  . Elevated troponin 08/26/2015  . Acute respiratory distress 08/26/2015  . Volume overload 08/26/2015  . Thrombocytopenia ([place]) 08/26/2015  . Fall 08/26/2015  . ESRD on dialysis ([place])   . Adjustment disorder with mixed anxiety and depressed mood 11/15/2014  . CKD (chronic kidney disease) stage V requiring chronic dialysis ([place]) 11/09/2014  . HTN (hypertension) 11/09/2014  . History of pacemaker 11/09/2014  . History of bladder cancer 11/09/2014  . Presence of urostomy ([place]) 11/09/2014  . Anemia in chronic kidney disease (CKD) 11/09/2014    Past Surgical History:  Procedure Laterality Date  . APPENDECTOMY    . AV FISTULA PLACEMENT Right 10/01/2014   Procedure: INSERTION OF ARTERIOVENOUS (AV) GORE-TEX GRAFT RIGHT ARM;  Surgeon: Elam Dutch, MD;  Location: [facility];  Service: Vascular;  Laterality: Right;  . BLADDER REMOVAL    .  CHOLECYSTECTOMY    . INSERT / REPLACE / REMOVE PACEMAKER    . IR GENERIC HISTORICAL Left 01/06/2016   IR THROMBECTOMY AV FISTULA W/THROMBOLYSIS/PTA INC/SHUNT/IMG LEFT 01/06/2016 [facility]  . IR GENERIC HISTORICAL  01/06/2016   IR US GUIDE VASC ACCESS RIGHT 01/06/2016 [facility]  . PERMANENT PACEMAKER INSERTION    . REVISION OF SCAR ON FACE/HEAD     due to wound   . TONSILLECTOMY    . TONSILLECTOMY         Home Medications    Prior to Admission medications   Medication Sig Start Date End Date Taking? Authorizing Provider  acetaminophen (TYLENOL) 325 MG tablet Take 650 mg by mouth every 6 (six) hours as needed for mild pain.   Yes Historical Provider, MD  atorvastatin (LIPITOR) 10 MG tablet Take 10 mg by mouth daily.   Yes Historical Provider, MD  Cholecalciferol 1000 units tablet Take 1,000 Units by mouth daily.   Yes Historical Provider, MD  diltiazem (TIAZAC) 120 MG 24 hr capsule Take 120 mg by mouth daily.   Yes Historical Provider, MD  ENSURE (ENSURE) Take 237 mLs by mouth daily.   Yes Historical Provider, MD  multivitamin (RENA-VIT) TABS tablet Take 1 tablet by mouth at bedtime. 11/13/14  Yes Maryann Mikhail, DO  QUEtiapine (SEROQUEL) 50 MG tablet Take 50 mg by mouth 2 (two) times daily.   Yes Historical Provider, MD  ranitidine (ZANTAC) 150 MG tablet Take 150 mg by mouth daily.   Yes  Historical Provider, MD  sevelamer carbonate (RENVELA) 800 MG tablet Take 1,600 mg by mouth 3 (three) times daily with meals.   Yes Historical Provider, MD  traZODone (DESYREL) 50 MG tablet Take 50 mg by mouth at bedtime.   Yes Historical Provider, MD  aspirin EC 81 MG tablet Take 81 mg by mouth daily.    Historical Provider, MD  calcitRIOL (ROCALTROL) 0.25 MCG capsule Take 0.25 mcg by mouth daily.    Historical Provider, MD  darbepoetin (ARANESP) 60 MCG/0.3ML SOLN injection Inject 60 mcg into the skin every 7 (seven) days.    Historical Provider, MD  metoprolol tartrate (LOPRESSOR) 25 MG tablet  Take 12.5 mg by mouth 2 (two) times daily.     Historical Provider, MD  omeprazole (PRILOSEC) 40 MG capsule Take 40 mg by mouth daily.    Historical Provider, MD  temazepam (RESTORIL) 15 MG capsule Take 15 mg by mouth at bedtime as needed for sleep.    Historical Provider, MD  triamcinolone cream (KENALOG) 0.1 % Apply 1 application topically 2 (two) times daily.    Historical Provider, MD    Family History Family History  Problem Relation Age of Onset  . Stroke Mother   . Hypertension Mother   . Varicose Veins Mother   . Heart disease Mother   . Heart attack Father     Social History Social History  Substance Use Topics  . Smoking status: Former Smoker    Quit date: 04/19/1980  . Smokeless tobacco: Not on file  . Alcohol use 0.0 oz/week     Allergies   Patient has no known allergies.   Review of Systems Review of Systems  Constitutional: Negative for chills, diaphoresis and fever.  HENT: Negative for ear pain and sore throat.   Eyes: Negative for pain and visual disturbance.  Respiratory: Negative for cough and shortness of breath.   Cardiovascular: Positive for syncope. Negative for chest pain and palpitations.  Gastrointestinal: Negative for abdominal pain, bowel incontinence and vomiting.  Genitourinary: Negative for dysuria and hematuria.  Musculoskeletal: Negative for arthralgias and back pain.  Skin: Negative for color change and rash.  Neurological: Positive for syncope. Negative for dizziness, seizures, light-headedness and headaches.  All other systems reviewed and are negative.    Physical Exam Updated Vital Signs BP 139/98   Pulse 78   Temp 97.7 F (36.5 C) (Oral)   Resp 15   SpO2 95%   Physical Exam  Constitutional: He is oriented to person, place, and time. He appears well-developed and well-nourished.  HENT:  Head: Normocephalic and atraumatic.  Eyes: Conjunctivae are normal.  Neck: Neck supple.  Cardiovascular: Normal rate and regular rhythm.    No murmur heard. Pulmonary/Chest: Effort normal and breath sounds normal. No respiratory distress.  Abdominal: Soft. He exhibits no mass. There is no tenderness. There is no guarding.    Musculoskeletal: He exhibits no edema.  Neurological: He is alert and oriented to person, place, and time. No cranial nerve deficit or sensory deficit ( ). He exhibits normal muscle tone. Coordination normal.  Skin: Skin is warm and dry. Capillary refill takes less than 2 seconds.  Psychiatric: He has a normal mood and affect.  Nursing note and vitals reviewed.    ED Treatments / Results  Labs (all labs ordered are listed, but only abnormal results are displayed) Labs Reviewed  CBC WITH DIFFERENTIAL/PLATELET - Abnormal; Notable for the following:       Result Value   RBC 3.38 (*)  Hemoglobin 11.3 (*)    HCT 34.5 (*)    MCV 102.1 (*)    Platelets 120 (*)    Lymphs Abs 0.6 (*)    All other components within normal limits  BASIC METABOLIC PANEL - Abnormal; Notable for the following:    Chloride 95 (*)    Glucose, Bld 147 (*)    BUN 54 (*)    Creatinine, Ser 6.90 (*)    Calcium 8.5 (*)    GFR calc non Af Amer 6 (*)    GFR calc Af Amer 7 (*)    All other components within normal limits  CBG MONITORING, ED - Abnormal; Notable for the following:    Glucose-Capillary 136 (*)    All other components within normal limits  URINALYSIS, ROUTINE W REFLEX MICROSCOPIC (NOT AT [facility])  I-STAT TROPOININ, ED  I-STAT TROPOININ, ED    EKG  EKG Interpretation  Date/Time:  Monday March 15 2016 13:12:18 EST Ventricular Rate:  76 PR Interval:    QRS Duration: 171 QT Interval:  466 QTC Calculation: 524 R Axis:   -84 Text Interpretation:  Sinus rhythm IVCD, consider atypical RBBB Inferior infarct, acute Anterolateral infarct, old When compared to prior ECG, T waves are not upright in Leads V3,V4,V5 No STEMI Confirmed by Sherry Ruffing MD, CHRISTOPHER [phone]) on 03/15/2016 1:18:12 PM       Radiology Ct  Head Wo Contrast  Result Date: 03/15/2016 CLINICAL DATA:  Dialysis patient with syncope and confusion. EXAM: CT HEAD WITHOUT CONTRAST TECHNIQUE: Contiguous axial images were obtained from the base of the skull through the vertex without intravenous contrast. COMPARISON:  08/22/2014 CT head FINDINGS: BRAIN: There is sulcal and ventricular prominence consistent with superficial and central atrophy. No intraparenchymal hemorrhage, mass effect nor midline shift. There are patchy periventricular and subcortical white matter hypodensities consistent with chronic small vessel ischemic disease. No acute large vascular territory infarcts. No abnormal extra-axial fluid collections. Basal cisterns are patent. VASCULAR: Moderate calcific atherosclerosis of the carotid siphons. SKULL: No acute to skull fracture. No significant scalp soft tissue swelling. Stable small right parietal lytic focus of the skull possibly a small hemangioma. SINUSES/ORBITS: The mastoid air-cells are clear. The included paranasal sinuses are well-aerated.The included ocular globes and orbital contents are non-suspicious. Bilateral lens replacement. OTHER: None. IMPRESSION: 1. Generalized cerebral atrophy. 2. No acute intracranial abnormality. 3. Chronic small vessel ischemic disease of periventricular and subcortical white matter. Electronically Signed   By: Ashley Royalty M.D.   On: 03/15/2016 15:39    Procedures Procedures (including critical care time)  Medications Ordered in ED Medications - No data to display   Initial Impression / Assessment and Plan / ED Course  I have reviewed the triage vital signs and the nursing notes.  Pertinent labs & imaging results that were available during my care of the patient were reviewed by me and considered in my medical decision making (see chart for details).  Clinical Course    82 year old gentleman comes to [place] after syncopal episode while at dialysis. He did not recall feeling any particular  way beforehand just remembers waking up with many people around him trying to arouse him. He has no symptoms right now. His neurologic exam was without deficit. Vital signs are stable he's afebrile. Laboratory testing shows a stable creatinine and BUN when compared to prior. Electrolytes are not concerning for cause of syncope. EKG shows a paced rhythm with no ischemic changes. Chest x-ray with no acute cardiopulmonary pathology. CT head shows no  acute intracranial abnormalities. Patient's pacemaker will be interrogated and he will be admitted for further observation and possible provocative testing or echocardiogram. The remainder this patient's care please see inpatient team notes. Vital signs stable time handoff care.  Final Clinical Impressions(s) / ED Diagnoses   Final diagnoses:  Syncope and collapse    New Prescriptions New Prescriptions   No medications on file     Dewaine Conger, MD 03/15/16 March ARB, MD 03/15/16 2158

## 2016-03-16 ENCOUNTER — Encounter (HOSPITAL_COMMUNITY): Payer: Self-pay | Admitting: General Practice

## 2016-03-16 DIAGNOSIS — R55 Syncope and collapse: Secondary | ICD-10-CM | POA: Diagnosis not present

## 2016-03-16 DIAGNOSIS — I441 Atrioventricular block, second degree: Secondary | ICD-10-CM

## 2016-03-16 DIAGNOSIS — Z823 Family history of stroke: Secondary | ICD-10-CM | POA: Diagnosis not present

## 2016-03-16 DIAGNOSIS — I480 Paroxysmal atrial fibrillation: Secondary | ICD-10-CM

## 2016-03-16 DIAGNOSIS — Z992 Dependence on renal dialysis: Secondary | ICD-10-CM | POA: Diagnosis not present

## 2016-03-16 DIAGNOSIS — Z8249 Family history of ischemic heart disease and other diseases of the circulatory system: Secondary | ICD-10-CM | POA: Diagnosis not present

## 2016-03-16 DIAGNOSIS — Z950 Presence of cardiac pacemaker: Secondary | ICD-10-CM | POA: Diagnosis not present

## 2016-03-16 DIAGNOSIS — G252 Other specified forms of tremor: Secondary | ICD-10-CM | POA: Diagnosis not present

## 2016-03-16 DIAGNOSIS — N186 End stage renal disease: Secondary | ICD-10-CM

## 2016-03-16 DIAGNOSIS — Z87891 Personal history of nicotine dependence: Secondary | ICD-10-CM

## 2016-03-16 LAB — BASIC METABOLIC PANEL
ANION GAP: 13 (ref 5–15)
BUN: 72 mg/dL — ABNORMAL HIGH (ref 6–20)
CALCIUM: 8.7 mg/dL — AB (ref 8.9–10.3)
CHLORIDE: 99 mmol/L — AB (ref 101–111)
CO2: 26 mmol/L (ref 22–32)
Creatinine, Ser: 7.92 mg/dL — ABNORMAL HIGH (ref 0.61–1.24)
GFR calc non Af Amer: 5 mL/min — ABNORMAL LOW (ref >60)
GFR, EST AFRICAN AMERICAN: 6 mL/min — AB (ref >60)
Glucose, Bld: 87 mg/dL (ref 65–99)
POTASSIUM: 5.1 mmol/L (ref 3.5–5.1)
Sodium: 138 mmol/L (ref 135–145)

## 2016-03-16 LAB — CBC
HEMATOCRIT: 30.9 % — AB (ref 39.0–52.0)
Hemoglobin: 10.3 g/dL — ABNORMAL LOW (ref 13.0–17.0)
MCH: 33.8 pg (ref 26.0–34.0)
MCHC: 33.3 g/dL (ref 30.0–36.0)
MCV: 101.3 fL — AB (ref 78.0–100.0)
Platelets: 122 10*3/uL — ABNORMAL LOW (ref 150–400)
RBC: 3.05 MIL/uL — ABNORMAL LOW (ref 4.22–5.81)
RDW: 15.3 % (ref 11.5–15.5)
WBC: 6 10*3/uL (ref 4.0–10.5)

## 2016-03-16 LAB — TROPONIN I

## 2016-03-16 MED ORDER — HALOPERIDOL LACTATE 5 MG/ML IJ SOLN
1.0000 mg | Freq: Once | INTRAMUSCULAR | Status: AC
  Administered 2016-03-16: 1 mg via INTRAVENOUS
  Filled 2016-03-16: qty 1

## 2016-03-16 NOTE — Progress Notes (Signed)
Patient is acting restless tonight....pulling off clothes and thrashing around in the bed.  Will page on call for orders.

## 2016-03-16 NOTE — Care Management Note (Addendum)
Case Management Note  Patient Details  Name: Robert Morgan MRN: [id number] Date of Birth: 1927/09/27  Subjective/Objective:     CM following for progression and d/c planning.               Action/Plan: 03/16/2016 Met with pt who is resident of [facility].  Pt plan is to return to that facility, pt anxious to d/c , today if possible. Per pt RN the pt can be transported by [facility] back to his SNF.   Expected Discharge Date:     03/17/2016             Expected Discharge Plan:  [place]  In-House Referral:  Clinical Social Work  Discharge planning Services  NA  Post Acute Care Choice:  NA Choice offered to:  NA  DME Arranged:   NA DME Agency:   NA  HH Arranged:   NA HH Agency:   NA  Status of Service:  Completed, signed off  If discussed at [employer] of Stay Meetings, dates discussed:    Additional Comments:  Adron Bene, RN 03/16/2016, 11:44 AM

## 2016-03-16 NOTE — Care Management Obs Status (Signed)
[facility] NOTIFICATION   Patient Details  Name: Robert Morgan MRN: [id number] Date of Birth: 1927/08/12   Medicare Observation Status Notification Given:  Yes    Olander Friedl, Rory Percy, RN 03/16/2016, 11:39 AM

## 2016-03-16 NOTE — Progress Notes (Addendum)
Subjective: Currently, the patient continues to feel well w/o complaints. He does note some dizziness with postural changes. Denies CP, palpitations, SOB.  Interval Events: Significantly orthostatic with 59mmHg SBP drop on testing. Likely poor compensatory response with pacer, but suspect hypovolemia contributing. Dialysis center ([facility] in [place]), reports no abnormalities with tx, no changes to dry wt and no significant diuresis.  Objective: Vital signs in last 24 hours: Vitals:   03/15/16 1700 03/15/16 2230 03/16/16 0409 03/16/16 0946  BP:  140/64  (!) 132/57  Pulse:  86  70  Resp:  18 17 18   Temp: 98 F (36.7 C) 98.1 F (36.7 C) 98.8 F (37.1 C) 97.8 F (36.6 C)  TempSrc: Oral Oral Oral Oral  SpO2:  97% 94% 93%  Weight: 73 kg (160 lb 15 oz) 74.1 kg (163 lb 6.4 oz)    Height: 5\' 1"  (1.549 m)      24-hour weight change: Filed Weights   03/15/16 1700 03/15/16 2230  Weight: 73 kg (160 lb 15 oz) 74.1 kg (163 lb 6.4 oz)   Intake/Output:  11/27 0701 - 11/28 0700 In: 67 [P.O.:60] Out: 0     Physical Exam: Physical Exam  Constitutional: He is oriented to person, place, and time. No distress.  Cardiovascular: Normal rate, regular rhythm and intact distal pulses.   Murmur heard.  Systolic murmur is present with a grade of 2/6  Pulmonary/Chest: Effort normal and breath sounds normal. No respiratory distress. He has no wheezes. He has no rales.  Abdominal: Soft. Bowel sounds are normal. There is no tenderness.  Musculoskeletal: He exhibits no edema or tenderness.  Neurological: He is alert and oriented to person, place, and time.  Skin: Skin is warm and dry.   Labs: CBC:  Recent Labs Lab 03/15/16 1336 03/16/16 0437  WBC 5.8 6.0  NEUTROABS 4.4  --   HGB 11.3* 10.3*  HCT 34.5* 30.9*  MCV 102.1* 101.3*  PLT 123456* 123XX123*   Metabolic Panel:  Recent Labs Lab 03/15/16 1336 03/16/16 0437  NA 137 138  K 4.8 5.1  CL 95* 99*  CO2 30 26  GLUCOSE 147* 87  BUN 54*  72*  CREATININE 6.90* 7.92*  CALCIUM 8.5* 8.7*   Cardiac Labs:  Recent Labs Lab 03/15/16 1349 03/15/16 1647 03/16/16 0437  TROPIPOC 0.01 0.01  --   TROPONINI  --   --  <0.03   BG:  Recent Labs Lab 03/15/16 1320  GLUCAP 136*   Lab Results  Component Value Date   HGBA1C 5.6 08/26/2015   Imaging: CXR w/ diffuse vascular congestion concerning for early pulm edema CT Head normal   Medications: Scheduled Medications: . atorvastatin  10 mg Oral Q2000  . cholecalciferol  1,000 Units Oral Daily  . diltiazem  120 mg Oral Daily  . famotidine  20 mg Oral Daily  . heparin  5,000 Units Subcutaneous Q8H  . lactose free nutrition  237 mL Oral Daily  . multivitamin  1 tablet Oral QHS  . QUEtiapine  50 mg Oral BID  . sevelamer carbonate  1,600 mg Oral TID WC  . sodium chloride flush  3 mL Intravenous Q12H  . traZODone  50 mg Oral QHS   PRN Medications: acetaminophen  Assessment/Plan: Robert Morgan is a 82 y.o. male with PAF, AV block and pacemaker, ESRD on HD who presents with syncope.  1) Syncope: Pt w/ pacemaker and h/o complete AV block and PAF. No prodrome and with full resolution of symptoms  following event highly suggestive of cardiogenic event, which is very concerning for pt with multiple conduction abnormalities. No neurologic findings, head CT w/o acute event, no signs of infection w/o leukocytosis/fever, CXR w/o signs of PNA. [employer] to interrogate pacemaker. No events on Tele ON. Orthostatic last night w/ 88mmHg SBP drop, remain orthostatic today w/ 61mmHg drop. - BCx NGTD  2) ESRD on HD: only received 30 min of treatment yesterday. Electrolytes stable with K of 5.1 today. ON HD for 1.5 years, dialysis center reports no previous issues.  3) PAF: NSR and paced. BP currently stable. Continue home dilt XR 120mg  qD.  Length of Stay: 0 day(s) Dispo: Anticipated discharge pending interrogation of pacemaker and clearance of cardiac pathology.  Holley Raring,  MD Pager: [phone] (7AM-5PM) 03/16/2016, 2:15 PM

## 2016-03-17 ENCOUNTER — Other Ambulatory Visit (HOSPITAL_COMMUNITY): Payer: Self-pay

## 2016-03-17 DIAGNOSIS — N186 End stage renal disease: Secondary | ICD-10-CM | POA: Diagnosis not present

## 2016-03-17 DIAGNOSIS — G20 Parkinson's disease: Secondary | ICD-10-CM

## 2016-03-17 DIAGNOSIS — I480 Paroxysmal atrial fibrillation: Secondary | ICD-10-CM | POA: Diagnosis not present

## 2016-03-17 DIAGNOSIS — R55 Syncope and collapse: Secondary | ICD-10-CM | POA: Diagnosis not present

## 2016-03-17 DIAGNOSIS — I441 Atrioventricular block, second degree: Secondary | ICD-10-CM | POA: Diagnosis not present

## 2016-03-17 DIAGNOSIS — G252 Other specified forms of tremor: Secondary | ICD-10-CM

## 2016-03-17 MED ORDER — SODIUM CHLORIDE 0.9 % IV SOLN
125.0000 mg | INTRAVENOUS | Status: DC
  Administered 2016-03-18: 125 mg via INTRAVENOUS
  Filled 2016-03-17 (×2): qty 10

## 2016-03-17 MED ORDER — CALCITRIOL 0.25 MCG PO CAPS
0.2500 ug | ORAL_CAPSULE | ORAL | Status: DC
  Administered 2016-03-18: 0.25 ug via ORAL

## 2016-03-17 NOTE — Clinical Social Work Note (Signed)
Patient medically stable for discharge back to [facility] today after dialysis. Facility advised and discharge clinicals transmitted to facility. Patient's daughter Octavio Graves [phone]) contacted and informed. Mr. Buice will be transported back to facility by ambulance.  Makayia Duplessis Givens, MSW, LCSW Licensed Clinical Social Worker [facility] [phone]

## 2016-03-17 NOTE — Clinical Social Work Note (Signed)
Clinical Social Work Assessment  Patient Details  Name: Robert Morgan MRN: [id number] Date of Birth: 1927-11-13  Date of referral:  03/15/16               Reason for consult:  Facility Placement                Permission sought to share information with:  Family Supports Permission granted to share information::  No (Patient has dementia and unable to consent)  Name::     PACE staff, Robert Morgan  Agency::  [facility]  Relationship::  PACE Staff and daughter Robert Morgan Information:  Daughter's phone - [phone]  Housing/Transportation Living arrangements for the past 2 months:  [place] of Information:  Adult Children Patient Interpreter Needed:  None Criminal Activity/Legal Involvement Pertinent to Current Situation/Hospitalization:  No - Comment as needed Significant Relationships:  Adult Children, Spouse Lives with:  Facility Resident ([facility] ) Do you feel safe going back to the place where you live?  Yes Need for family participation in patient care:  Yes (Comment)  Care giving concerns:  Daughter expressed no concerns regarding patient's care at skilled facility.   Social Worker assessment / plan:  CSW talked by phone with patient's daughter Robert Morgan [phone]) and informed her of discharge. Ms. Kenton Kingfisher reported that her dad has been at facility for about 3 months and her mother is at another facility in [place]. When asked, daughter feels that patient's placement will be permanent due to his dementia and safety issues with him being home alone, however daughter indicated that her dad feels that he is fine to be at home alone. Daughter reported that patient was placed at the facility in [place] by the PACE program due to his dementia.  Employment status:  Retired Forensic scientist:  Other (Comment Required), Managed Medicare PT Recommendations:  No Follow Up Information / Referral to community resources:  Other (Comment  Required) (None needed or requested as patient from skilled facility and involved with PACE program)  Patient/Family's Response to care:  No concerns expressed by daughter regarding patient care during hospitalization.  Patient/Family's Understanding of and Emotional Response to Diagnosis, Current Treatment, and Prognosis:  Daughter believes that patient dementia is progressive and his stay at the skilled facility will be permanent.  Emotional Assessment Appearance:  Appears stated age Attitude/Demeanor/Rapport:  Other (Confused) Affect (typically observed):  Other (Confused) Orientation:  Oriented to Self Alcohol / Substance use:  Tobacco Use, Alcohol Use, Illicit Drugs (Patient reports remote history of smoking, reported no alcohol or drug use) Psych involvement (Current and /or in the community):  No (Comment)  Discharge Needs  Concerns to be addressed:  Discharge Planning Concerns (Patient will return to [facility]) Readmission within the last 30 days:  No Current discharge risk:  None Barriers to Discharge:  No Barriers Identified   Sable Feil, LCSW 03/17/2016, 6:47 PM

## 2016-03-17 NOTE — Consult Note (Signed)
[facility] Renal Consultation Note    Indication for Consultation:  Management of ESRD/hemodialysis; anemia, hypertension/volume and secondary hyperparathyroidism PCP: Dr. Dineen Kid, [facility]  HPI: Robert Morgan is a 82 y.o. male with ESRD ([name]) presumed secondary to DM on HD since July 2016.  PMHx is significant for afib/PM, hx bladder cancer with ileal loop/urostomy with bilateral ureteral stents secondary to obstruction,  And Lewey boday demential diagnosed 2017 per outpatient dialysis records. The patient presented here 11/27 after a syncopal even 35 minutes into dialysis. He thinks that he fell asleep (the same story he told the admitting MD.)  His goal had been about 3.5 L not any larger than previous treatents.  Pre HD BP was 136/69.  H has no SOB, CP, N, V, fever or chills. He is a remote smoker. His tremor is chronic. Labs upon admission showed K 4.8 hgb 11.3 normal WBC and diff. Troponins have been flat . K was 5.1 11/28 hgb down to 10.3. CXR pulmonary vascular congestion ? Early pulmonary edema.  Today he is on room air with adequate sats. He tells me that "that doctor who sees him wrote a letter so he couldn't drive anymore."  Past Medical History:  Diagnosis Date  . Anemia   . Atrial fibrillation ([place])   . Cancer ([place])    bladder -   . ESRD (end stage renal disease) on dialysis [facility])    "MWF; [place]" (03/16/2016)  . History of kidney stones   . Hyperlipidemia   . Hypertension   . Pacemaker    Past Surgical History:  Procedure Laterality Date  . APPENDECTOMY    . AV FISTULA PLACEMENT Right 10/01/2014   Procedure: INSERTION OF ARTERIOVENOUS (AV) GORE-TEX GRAFT RIGHT ARM;  Surgeon: Elam Dutch, MD;  Location: [facility];  Service: Vascular;  Laterality: Right;  . BLADDER REMOVAL    . CHOLECYSTECTOMY    . INSERT / REPLACE / REMOVE PACEMAKER    . IR GENERIC HISTORICAL Left 01/06/2016   IR THROMBECTOMY AV FISTULA W/THROMBOLYSIS/PTA INC/SHUNT/IMG  LEFT 01/06/2016 [facility]INTER[facility] RAD  . IR GENERIC HISTORICAL  01/06/2016   IR US GUIDE VASC ACCESS RIGHT 01/06/2016 [facility]INTER[facility]  . PERMANENT PACEMAKER INSERTION    . REVISION OF SCAR ON FACE/HEAD     due to wound   . TONSILLECTOMY    . TONSILLECTOMY     Family History  Problem Relation Age of Onset  . Stroke Mother   . Hypertension Mother   . Varicose Veins Mother   . Heart disease Mother   . Heart attack Father    Social History:  reports that he quit smoking about 35 years ago. He does not have any smokeless tobacco history on file. He reports that he drinks alcohol. He reports that he does not use drugs. No Known Allergies Prior to Admission medications   Medication Sig Start Date End Date Taking? Authorizing Provider  acetaminophen (TYLENOL) 325 MG tablet Take 650 mg by mouth every 6 (six) hours as needed for mild pain.   Yes Historical Provider, MD  atorvastatin (LIPITOR) 10 MG tablet Take 10 mg by mouth daily.   Yes Historical Provider, MD  Cholecalciferol 1000 units tablet Take 1,000 Units by mouth daily.   Yes Historical Provider, MD  diltiazem (TIAZAC) 120 MG 24 hr capsule Take 120 mg by mouth daily.   Yes Historical Provider, MD  ENSURE (ENSURE) Take 237 mLs by mouth daily.   Yes Historical Provider, MD  multivitamin (RENA-VIT)  TABS tablet Take 1 tablet by mouth at bedtime. 11/13/14  Yes Maryann Mikhail, DO  QUEtiapine (SEROQUEL) 50 MG tablet Take 50 mg by mouth 2 (two) times daily.   Yes Historical Provider, MD  ranitidine (ZANTAC) 150 MG tablet Take 150 mg by mouth daily.   Yes Historical Provider, MD  sevelamer carbonate (RENVELA) 800 MG tablet Take 1,600 mg by mouth 3 (three) times daily with meals.   Yes Historical Provider, MD  traZODone (DESYREL) 50 MG tablet Take 50 mg by mouth at bedtime.   Yes Historical Provider, MD  aspirin EC 81 MG tablet Take 81 mg by mouth daily.    Historical Provider, MD  calcitRIOL (ROCALTROL) 0.25 MCG capsule Take 0.25 mcg by mouth daily.     Historical Provider, MD  darbepoetin (ARANESP) 60 MCG/0.3ML SOLN injection Inject 60 mcg into the skin every 7 (seven) days.    Historical Provider, MD  metoprolol tartrate (LOPRESSOR) 25 MG tablet Take 12.5 mg by mouth 2 (two) times daily.     Historical Provider, MD  omeprazole (PRILOSEC) 40 MG capsule Take 40 mg by mouth daily.    Historical Provider, MD  temazepam (RESTORIL) 15 MG capsule Take 15 mg by mouth at bedtime as needed for sleep.    Historical Provider, MD  triamcinolone cream (KENALOG) 0.1 % Apply 1 application topically 2 (two) times daily.    Historical Provider, MD   Current Facility-Administered Medications  Medication Dose Route Frequency Provider Last Rate Last Dose  . acetaminophen (TYLENOL) tablet 650 mg  650 mg Oral Q6H PRN Holley Raring, MD      . atorvastatin (LIPITOR) tablet 10 mg  10 mg Oral Q2000 Holley Raring, MD   10 mg at 03/16/16 2116  . calcitRIOL (ROCALTROL) capsule 0.25 mcg  0.25 mcg Oral Q M,W,F-HD Alric Seton, PA-C      . cholecalciferol (VITAMIN D) tablet 1,000 Units  1,000 Units Oral Daily Holley Raring, MD   1,000 Units at 03/17/16 1111  . diltiazem (CARDIZEM CD) 24 hr capsule 120 mg  120 mg Oral Daily Holley Raring, MD   120 mg at 03/17/16 1112  . famotidine (PEPCID) tablet 20 mg  20 mg Oral Daily Holley Raring, MD   20 mg at 03/17/16 1111  . ferric gluconate (NULECIT) 125 mg in sodium chloride 0.9 % 100 mL IVPB  125 mg Intravenous Q M,W,F-HD Alric Seton, PA-C      . heparin injection 5,000 Units  5,000 Units Subcutaneous Q8H Holley Raring, MD   5,000 Units at 03/17/16 0555  . lactose free nutrition (Boost) liquid 237 mL  237 mL Oral Daily Holley Raring, MD   237 mL at 03/16/16 1111  . multivitamin (RENA-VIT) tablet 1 tablet  1 tablet Oral QHS Holley Raring, MD   1 tablet at 03/16/16 2116  . QUEtiapine (SEROQUEL) tablet 50 mg  50 mg Oral BID Holley Raring, MD   50 mg at 03/17/16 1112  . sevelamer carbonate (RENVELA) tablet 1,600 mg  1,600 mg Oral  TID WC Holley Raring, MD   1,600 mg at 03/17/16 0902  . sodium chloride flush (NS) 0.9 % injection 3 mL  3 mL Intravenous Q12H Holley Raring, MD   3 mL at 03/17/16 1116  . traZODone (DESYREL) tablet 50 mg  50 mg Oral QHS Holley Raring, MD   50 mg at 03/16/16 2116   Labs: Basic Metabolic Panel:  Recent Labs Lab 03/15/16 1336 03/16/16 0437  NA 137 138  K 4.8 5.1  CL 95* 99*  CO2 30 26  GLUCOSE 147* 87  BUN 54* 72*  CREATININE 6.90* 7.92*  CALCIUM 8.5* 8.7*   Liver Function Tests: No results for input(s): AST, ALT, ALKPHOS, BILITOT, PROT, ALBUMIN in the last 168 hours. No results for input(s): LIPASE, AMYLASE in the last 168 hours. No results for input(s): AMMONIA in the last 168 hours. CBC:  Recent Labs Lab 03/15/16 1336 03/16/16 0437  WBC 5.8 6.0  NEUTROABS 4.4  --   HGB 11.3* 10.3*  HCT 34.5* 30.9*  MCV 102.1* 101.3*  PLT 120* 122*   Cardiac Enzymes:  Recent Labs Lab 03/16/16 0437  TROPONINI <0.03   CBG:  Recent Labs Lab 03/15/16 1320  GLUCAP 136*   Iron Studies: No results for input(s): IRON, TIBC, TRANSFERRIN, FERRITIN in the last 72 hours. Studies/Results: Ct Head Wo Contrast  Result Date: 03/15/2016 CLINICAL DATA:  Dialysis patient with syncope and confusion. EXAM: CT HEAD WITHOUT CONTRAST TECHNIQUE: Contiguous axial images were obtained from the base of the skull through the vertex without intravenous contrast. COMPARISON:  08/22/2014 CT head FINDINGS: BRAIN: There is sulcal and ventricular prominence consistent with superficial and central atrophy. No intraparenchymal hemorrhage, mass effect nor midline shift. There are patchy periventricular and subcortical white matter hypodensities consistent with chronic small vessel ischemic disease. No acute large vascular territory infarcts. No abnormal extra-axial fluid collections. Basal cisterns are patent. VASCULAR: Moderate calcific atherosclerosis of the carotid siphons. SKULL: No acute to skull fracture. No  significant scalp soft tissue swelling. Stable small right parietal lytic focus of the skull possibly a small hemangioma. SINUSES/ORBITS: The mastoid air-cells are clear. The included paranasal sinuses are well-aerated.The included ocular globes and orbital contents are non-suspicious. Bilateral lens replacement. OTHER: None. IMPRESSION: 1. Generalized cerebral atrophy. 2. No acute intracranial abnormality. 3. Chronic small vessel ischemic disease of periventricular and subcortical white matter. Electronically Signed   By: Ashley Royalty M.D.   On: 03/15/2016 15:39   Dg Chest Port 1 View  Result Date: 03/15/2016 CLINICAL DATA:  Initial evaluation for mild shortness of breath. History of hypertension. EXAM: PORTABLE CHEST 1 VIEW COMPARISON:  Prior radiograph from 08/26/2015. FINDINGS: Pool deep left-sided pacemaker/ AICD with electrodes overlying the right atrium right ventricle in place, stable. Mild cardiomegaly is unchanged. Mediastinal silhouette within normal limits. Aortic atherosclerosis noted. Lungs are hypoinflated. Patchy and linear bibasilar opacities felt to most likely reflect atelectasis. There is mild diffuse pulmonary vascular congestion with fullness of the interstitial markings, suspicious for possible early and/ or developing edema. No focal infiltrates. No pleural effusion. No pneumothorax. No acute osseous abnormality. IMPRESSION: 1. Diffuse pulmonary vascular congestion with fullness of the interstitial markings, suspicious for early and/or developing pulmonary interstitial edema. 2. Superimposed mild bibasilar atelectasis. 3. Aortic atherosclerosis. Electronically Signed   By: Jeannine Boga M.D.   On: 03/15/2016 18:24    ROS: As per HPI otherwise negative.  Physical Exam: Vitals:   03/16/16 2143 03/17/16 0644 03/17/16 1000 03/17/16 1111  BP: (!) 140/58  136/62 140/66  Pulse: 70 69 70   Resp: 18 20 18    Temp: 98.3 F (36.8 C) 98.2 F (36.8 C) 97.6 F (36.4 C)   TempSrc:  Axillary  Oral   SpO2: 96% 93% 95%   Weight: 74.6 kg (164 lb 7.4 oz)     Height:         General: elderly weak WM Head: NCAT sclera not icteric MMM Neck: Supple.  Lungs:  Dim BS with few crackles -  has difficult time holding self up in sitting position. Breathing is unlabored. Heart: RRR w 2/6 murmur Abdomen: soft NT + BS Lower extremities:without edema or ischemic changes, no open wounds  Neuro:  Chronic tremor or arms L>R Psych:  Responds to questions appropriately with a normal affect. Dialysis Access: right upper AVGG + bruit  Dialysis Orders: [facility] MWF 3.25 hours 2 K 2.25 EDW 71.5 - 3 Kg above edw Monday 400/800 no heparin - not sure why not, right upper AVGG venofer 100 x 10 started 11/20, calcitriol 0.25 nad Mircera 75 q 4 weeks - last given 11/8 Recent labs: hgb 11.4 21% sat iPTH 145 Ca/P ok  Assessment/Plan: 1. Syncope- hx PAF/PM- per primary 2. ESRD -  HD today - I think he would be better served at a longer time given advanced age/co-morbids for more gentle fluid removal - increased to 3.5 hr - needs that or more at discharge; change profile 2 to 4  3. Hypertension/volume  - lower volume 4. Anemia  - continue Fe not due for ESA yet 5. Metabolic bone disease -  Continue calcitriol/binders 6. Nutrition - change to renal diet /vit 7. Lewey body dementia   Myriam Jacobson, PA-C [facility] [phone] 03/17/2016, 1:01 PM    Pt seen, examined and agree w A/P as above. ESRD pt w syncope on HD.  Not dry , BP's normal, last ECHO this summer showed LVEF 45-50% with possible hypokinesis of several wall segments. Has PPM.  CXR suggesting interstitial edema. No SOB or cough and no extra vol on exam but is up 3-4kg today.  Plan HD today, UF to dry wt as tolerated. W/U per primary.  Kelly Splinter MD [employer] pager [phone]   03/17/2016, 3:33 PM

## 2016-03-17 NOTE — Discharge Summary (Signed)
Name: Robert Morgan MRN: [id number] DOB: Apr 12, 1928 82 y.o. PCP: Leota Jacobsen, MD  Date of Admission: 03/15/2016 12:59 PM Date of Discharge: 03/17/2016 Attending Physician: Axel Filler, MD  Discharge Diagnosis: Active Problems:   History of pacemaker   ESRD on dialysis [facility])   Syncope   Parkinsonism [facility])   Discharge Medications:   Medication List    TAKE these medications   acetaminophen 325 MG tablet Commonly known as:  TYLENOL Take 650 mg by mouth every 6 (six) hours as needed for mild pain.   aspirin EC 81 MG tablet Take 81 mg by mouth daily.   atorvastatin 10 MG tablet Commonly known as:  LIPITOR Take 10 mg by mouth daily.   calcitRIOL 0.25 MCG capsule Commonly known as:  ROCALTROL Take 0.25 mcg by mouth daily.   Cholecalciferol 1000 units tablet Take 1,000 Units by mouth daily.   darbepoetin 60 MCG/0.3ML Soln injection Commonly known as:  ARANESP Inject 60 mcg into the skin every 7 (seven) days.   diltiazem 120 MG 24 hr capsule Commonly known as:  TIAZAC Take 120 mg by mouth daily.   ENSURE Take 237 mLs by mouth daily.   metoprolol tartrate 25 MG tablet Commonly known as:  LOPRESSOR Take 12.5 mg by mouth 2 (two) times daily.   multivitamin Tabs tablet Take 1 tablet by mouth at bedtime.   omeprazole 40 MG capsule Commonly known as:  PRILOSEC Take 40 mg by mouth daily.   QUEtiapine 50 MG tablet Commonly known as:  SEROQUEL Take 50 mg by mouth 2 (two) times daily.   ranitidine 150 MG tablet Commonly known as:  ZANTAC Take 150 mg by mouth daily.   sevelamer carbonate 800 MG tablet Commonly known as:  RENVELA Take 1,600 mg by mouth 3 (three) times daily with meals.   temazepam 15 MG capsule Commonly known as:  RESTORIL Take 15 mg by mouth at bedtime as needed for sleep.   traZODone 50 MG tablet Commonly known as:  DESYREL Take 50 mg by mouth at bedtime.   triamcinolone cream 0.1 % Commonly known as:  KENALOG Apply 1  application topically 2 (two) times daily.       Disposition and follow-up:   Mr.Valerie W Okin was discharged from [facility] in Good condition.  At the hospital follow up visit please address:  1.  Syncope: continue to monitor for further events. Watch fluid status as pt was noted to be orthostatic. ESRD: Received HD in house. Continue outpt HD on Friday. Parkinsonism: pt noted to have features of Parkinson's. Should have further evaluation to consider treatment. Recommend new transthoracic echocardiogram.   Follow-up Appointments: Follow-up Information    WITTEN, BOBBY D, MD. Schedule an appointment as soon as possible for a visit in 1 week(s).   Specialty:  Family Medicine Contact information: [address] [place] [address] [phone]           Hospital Course by problem list: Active Problems:   History of pacemaker   ESRD on dialysis ([place])   Syncope   Parkinsonism ([place])   1. Syncope: Pt presented from dialysis after suffering a syncopal event after 30 minutes of HD. Pt has a pacemaker and h/o complete AV block and PAF. Pt reported no prodrome and endorsed full resolution of symptoms following event. Pt is at high risk for cardiogenic syncope due to multiple conduction abnormalities and mild HF. Pt was found to be orthostatic with SBP drop of  64mmHg on postural change and received a small bolus of fluid. Fluid status was managed conservatively given CXR w/ evidence of mild pulmonary edema. Hypotensive syncope is less likely given the story of acute event which pt was sitting in his dialysis chair. Dialysis Center does not report any hypotension before or after this event. Pt had no focal neurologic findings and head CT demonstrated no acute event. There no signs of infection in urine, blood, on CXR, and patient did not have leukocytosis or fever. [employer] interrogated the  pacemaker w/o evidence of any ventricular arrhythmic events. Interrogation did  reveal several episodes of atrial tach consistent with PAF, though none were recorded during the syncopal episode. Pt remained on telemetry w/o evidence of arrhythmia.  2. ESRD: Pt only received 30 min of his usual 3 hr 15 min treatment on Monday and was dialyzed in house prior to discharge.  3. Parkinsonism: Pt was noted to have resting pill-rolling tremor, masked facies, and mild cognitive impairment consistent with Parkinson's on his evaluation. He should receive continued evaluation as an outpatient for concern of Parkinson's Disease.  Discharge Vitals:   BP 140/66   Pulse 70   Temp 97.6 F (36.4 C) (Oral)   Resp 18   Ht 5\' 1"  (1.549 m)   Wt 74.6 kg (164 lb 7.4 oz)   SpO2 95%   BMI 31.08 kg/m   Pertinent Labs, Studies, and Procedures: Noted above and imaging below.  Procedures Performed:  CT Head w/o Contrast IMPRESSION: 1. Generalized cerebral atrophy. 2. No acute intracranial abnormality. 3. Chronic small vessel ischemic disease of periventricular and subcortical white matter.  Chest XR IMPRESSION: 1. Diffuse pulmonary vascular congestion with fullness of the interstitial markings, suspicious for early and/or developing pulmonary interstitial edema. 2. Superimposed mild bibasilar atelectasis. 3. Aortic atherosclerosis.   Consultations: Treatment Team:  Roney Jaffe, MD  Discharge Instructions: Discharge Instructions    Diet - low sodium heart healthy    Complete by:  As directed    Discharge instructions    Complete by:  As directed    We have examined your heart to look for a cause of your fainting spell. We have not found any new abnormality which would explain this episode. We recommend you continue to follow up with your heart doctor and return for evaluation if you have any further episodes of loss of consciousness.  It was a pleasure to meet you.  Dr. Gay Filler   Increase activity slowly    Complete by:  As directed       Signed: Holley Raring,  MD 03/17/2016, 3:51 PM   Pager: [phone]

## 2016-03-17 NOTE — Progress Notes (Signed)
Internal Medicine Attending:   I saw and examined the patient on 11/28. I reviewed the resident's note and I agree with the resident's findings and plan as documented in the resident's note.  Please see my attestation H&P for further details. In brief this is an 82 year old man with a history of high grade AV heart block, chronic reduced ejection fraction heart failure, and hypokinetic septal wall on echo 1 year ago who was sent to the hospital from hemodialysis because of syncope while at rest. Description of the event seems most consistent with a high risk cardiogenic syncope, which is also likely because of his underlying heart disease. We are awaiting interrogation of his pacemaker by the [employer] representative. Plan to repeat echocardiogram. Continue to monitor on telemetry. On evaluation were also saying that he has a pill-rolling tremor at rest, masked facial expression, and some signs of autonomic instability with persistent orthostatic hypotension which all could be consistent with Parkinson's disease. This will need long-term follow-up, less likely related to syncopal episode.

## 2016-03-17 NOTE — Progress Notes (Signed)
Spoke with MD regarding the patients discharge. MD stated that the patient would have a outpatient ECHO completed. Also, patient is still waiting for HD tonight and will be transferred back to [place] after completion.

## 2016-03-17 NOTE — NC FL2 (Signed)
[place] LEVEL OF CARE SCREENING TOOL     IDENTIFICATION  Patient Name: Robert Morgan Birthdate: Jul 11, 1927 Sex: male Admission Date (Current Location): 03/15/2016  [facility] and [place] Number:  Kathleen Argue (Patient lives in [place]; hospitalized in [place])   Facility and Address:  [facility]. [facility], [place] [address], [place], [place] [address]      Provider Number: [id number]  Attending Physician Name and Address:  Axel Filler, MD  Relative Name and Phone Number:  Octavio Graves - Daughter; [phone]     Current Level of Care: Hospital Recommended Level of Care: [place] ([place]) Prior Approval Number:    Date Approved/Denied:   PASRR Number: [id number] Irene Shipper. 11/13/14)  Discharge Plan: SNF    Current Diagnoses: Patient Active Problem List   Diagnosis Date Noted  . Parkinsonism ([place]) 03/17/2016  . Syncope 03/15/2016  . Elevated troponin 08/26/2015  . Acute respiratory distress 08/26/2015  . Fall 08/26/2015  . ESRD on dialysis ([place])   . Adjustment disorder with mixed anxiety and depressed mood 11/15/2014  . CKD (chronic kidney disease) stage V requiring chronic dialysis ([place]) 11/09/2014  . HTN (hypertension) 11/09/2014  . History of pacemaker 11/09/2014  . History of bladder cancer 11/09/2014  . Presence of urostomy ([place]) 11/09/2014  . Anemia in chronic kidney disease (CKD) 11/09/2014    Orientation RESPIRATION BLADDER Height & Weight     Self, Time, Situation, Place  Normal Continent (Patient has urostomy) Weight: 164 lb 7.4 oz (74.6 kg) Height:  5\' 1"  (154.9 cm)  BEHAVIORAL SYMPTOMS/MOOD NEUROLOGICAL BOWEL NUTRITION STATUS      Continent Diet (Renal diet - low sodium/heart healthy with 1200 mL fluid restriction)  AMBULATORY STATUS COMMUNICATION OF NEEDS Skin   Limited Assist Verbally Normal                       Personal Care Assistance Level of Assistance   Bathing, Feeding, Dressing Bathing Assistance: Limited assistance Feeding assistance: Independent Dressing Assistance: Limited assistance     Functional Limitations Info  Sight, Hearing, Speech Sight Info: Adequate Hearing Info: Adequate Speech Info: Adequate    SPECIAL CARE FACTORS FREQUENCY                       Contractures Contractures Info: Not present    Additional Factors Info  Code Status, Allergies Code Status Info: Full Allergies Info: No known allergies           Current Medications (03/17/2016):  This is the current hospital active medication list Current Facility-Administered Medications  Medication Dose Route Frequency Provider Last Rate Last Dose  . acetaminophen (TYLENOL) tablet 650 mg  650 mg Oral Q6H PRN Holley Raring, MD      . atorvastatin (LIPITOR) tablet 10 mg  10 mg Oral Q2000 Holley Raring, MD   10 mg at 03/16/16 2116  . calcitRIOL (ROCALTROL) capsule 0.25 mcg  0.25 mcg Oral Q M,W,F-HD Alric Seton, PA-C      . cholecalciferol (VITAMIN D) tablet 1,000 Units  1,000 Units Oral Daily Holley Raring, MD   1,000 Units at 03/17/16 1111  . diltiazem (CARDIZEM CD) 24 hr capsule 120 mg  120 mg Oral Daily Holley Raring, MD   120 mg at 03/17/16 1112  . famotidine (PEPCID) tablet 20 mg  20 mg Oral Daily Holley Raring, MD   20 mg at 03/17/16 1111  . ferric gluconate (NULECIT) 125  mg in sodium chloride 0.9 % 100 mL IVPB  125 mg Intravenous Q M,W,F-HD Alric Seton, PA-C      . heparin injection 5,000 Units  5,000 Units Subcutaneous Q8H Holley Raring, MD   5,000 Units at 03/17/16 1343  . lactose free nutrition (Boost) liquid 237 mL  237 mL Oral Daily Holley Raring, MD   237 mL at 03/16/16 1111  . multivitamin (RENA-VIT) tablet 1 tablet  1 tablet Oral QHS Holley Raring, MD   1 tablet at 03/16/16 2116  . QUEtiapine (SEROQUEL) tablet 50 mg  50 mg Oral BID Holley Raring, MD   50 mg at 03/17/16 1112  . sevelamer carbonate (RENVELA) tablet 1,600 mg  1,600 mg Oral  TID WC Holley Raring, MD   1,600 mg at 03/17/16 1343  . sodium chloride flush (NS) 0.9 % injection 3 mL  3 mL Intravenous Q12H Holley Raring, MD   3 mL at 03/17/16 1116  . traZODone (DESYREL) tablet 50 mg  50 mg Oral QHS Holley Raring, MD   50 mg at 03/16/16 2116     Discharge Medications: Please see discharge summary for a list of discharge medications.  Relevant Imaging Results:  Relevant Lab Results:   Additional Information Wears dentures; permanent pacemaker.  [phone]. Dialysis TTS [place].  Sable Feil, LCSW

## 2016-03-17 NOTE — Progress Notes (Addendum)
Subjective: Currently, pt is feeling well. No complaints. Tele unremarkable ON. Continued to have mild sundowning.  Objective: Vital signs in last 24 hours: Vitals:   03/16/16 0946 03/16/16 1823 03/16/16 2143 03/17/16 0644  BP: (!) 132/57 139/60 (!) 140/58   Pulse: 70 70 70 69  Resp: 18 18 18 20   Temp: 97.8 F (36.6 C) 98 F (36.7 C) 98.3 F (36.8 C) 98.2 F (36.8 C)  TempSrc: Oral Oral Axillary   SpO2: 93% 95% 96% 93%  Weight:   74.6 kg (164 lb 7.4 oz)   Height:       24-hour weight change: Filed Weights   03/15/16 1700 03/15/16 2230 03/16/16 2143  Weight: 73 kg (160 lb 15 oz) 74.1 kg (163 lb 6.4 oz) 74.6 kg (164 lb 7.4 oz)   Intake/Output:  11/28 0701 - 11/29 0700 In: 59 [P.O.:580] Out: 475 [Urine:475]    Physical Exam: Physical Exam  Constitutional: He is oriented to person, place, and time. No distress.  Cardiovascular: Normal rate, regular rhythm and intact distal pulses.   Murmur heard.  Systolic murmur is present with a grade of 2/6  Pulmonary/Chest: Effort normal and breath sounds normal. No respiratory distress. He has no wheezes. He has no rales.  Abdominal: Soft. Bowel sounds are normal. There is no tenderness.  Musculoskeletal: He exhibits no edema or tenderness.  Neurological: He is alert and oriented to person, place, and time. He displays tremor (pill rolling).  Masked facies, mild cognitive impairment  Skin: Skin is warm and dry.   Labs: CBC:  Recent Labs Lab 03/15/16 1336 03/16/16 0437  WBC 5.8 6.0  NEUTROABS 4.4  --   HGB 11.3* 10.3*  HCT 34.5* 30.9*  MCV 102.1* 101.3*  PLT 123456* 123XX123*   Metabolic Panel:  Recent Labs Lab 03/15/16 1336 03/16/16 0437  NA 137 138  K 4.8 5.1  CL 95* 99*  CO2 30 26  GLUCOSE 147* 87  BUN 54* 72*  CREATININE 6.90* 7.92*  CALCIUM 8.5* 8.7*   Cardiac Labs:  Recent Labs Lab 03/15/16 1349 03/15/16 1647 03/16/16 0437  TROPIPOC 0.01 0.01  --   TROPONINI  --   --  <0.03   BG:  Recent Labs Lab  03/15/16 1320  GLUCAP 136*     Lab Results  Component Value Date   HGBA1C 5.6 08/26/2015    Imaging: CXR w/ diffuse vascular congestion concerning for early pulm edema CT Head normal   Medications: Scheduled Medications: . atorvastatin  10 mg Oral Q2000  . cholecalciferol  1,000 Units Oral Daily  . diltiazem  120 mg Oral Daily  . famotidine  20 mg Oral Daily  . heparin  5,000 Units Subcutaneous Q8H  . lactose free nutrition  237 mL Oral Daily  . multivitamin  1 tablet Oral QHS  . QUEtiapine  50 mg Oral BID  . sevelamer carbonate  1,600 mg Oral TID WC  . sodium chloride flush  3 mL Intravenous Q12H  . traZODone  50 mg Oral QHS   PRN Medications: acetaminophen  Assessment/Plan: Robert Morgan is a 82 y.o. male with PAF, AV block and pacemaker, ESRD on HD who presents with syncope.  1) Syncope: Pt w/ pacemaker and h/o complete AV block and PAF. No prodrome and with full resolution of symptoms following event highly suggestive of cardiogenic event, which is very concerning for pt with multiple conduction abnormalities. No neurologic findings, head CT w/o acute event, no signs of infection w/o leukocytosis/fever,  CXR w/o signs of PNA. [employer] pacemaker interrogation w/o ventricular events, several episodes of atrial tach consistent with PAF, though none recorded during syncopal episode. Tele remains unremarkable. - echocardiogram today  2) ESRD on HD: only received 30 min of treatment Monday. Electrolytes stable with K of 5.1 yesterday. May need HD today before DC. On HD for 1.5 years, dialysis center reports no previous issues. c/s Nephrology.  3) PAF: NSR and paced. BP currently stable. Continue home dilt XR 120mg  qD.  4) Parkinsonism: Pt with resting pill-rolling tremor, masked facies, and mild cognitive impairment consistent with Parkinson's. Will need out patient f/u.  Length of Stay: 0 day(s) Dispo: Anticipated discharge pending interrogation of pacemaker and  clearance of cardiac pathology.  Holley Raring, MD Pager: [phone] (7AM-5PM) 03/17/2016, 7:35 AM

## 2016-03-17 NOTE — Progress Notes (Signed)
Called report to Anderson Malta, Therapist, sports at [facility].

## 2016-03-18 MED ORDER — PENTAFLUOROPROP-TETRAFLUOROETH EX AERO: 1.0000 "application " | INHALATION_SPRAY | CUTANEOUS | Status: DC | PRN

## 2016-03-18 MED ORDER — LIDOCAINE-PRILOCAINE 2.5-2.5 % EX CREA: 1.0000 "application " | TOPICAL_CREAM | CUTANEOUS | Status: DC | PRN

## 2016-03-18 MED ORDER — SODIUM CHLORIDE 0.9 % IV SOLN: 100.0000 mL | INTRAVENOUS | Status: DC | PRN

## 2016-03-18 MED ORDER — LIDOCAINE HCL (PF) 1 % IJ SOLN: 5.0000 mL | INTRAMUSCULAR | Status: DC | PRN

## 2016-03-18 MED ORDER — CALCITRIOL 0.25 MCG PO CAPS
ORAL_CAPSULE | ORAL | Status: AC
  Administered 2016-03-18: 0.25 ug via ORAL
  Filled 2016-03-18: qty 1

## 2016-03-18 MED ORDER — ALTEPLASE 2 MG IJ SOLR: 2.0000 mg | Freq: Once | INTRAMUSCULAR | Status: DC | PRN

## 2016-03-18 MED ORDER — HEPARIN SODIUM (PORCINE) 1000 UNIT/ML DIALYSIS: 1000.0000 [IU] | INTRAMUSCULAR | Status: DC | PRN

## 2016-03-18 NOTE — Progress Notes (Signed)
[facility] here for transport to [facility]. Pt remains responsive. No s/s of distress. Dorthey Sawyer, RN

## 2016-03-18 NOTE — Progress Notes (Signed)
Pt. Has returned from HD. Call to [facility], to notify them that transportation has been called for pt, as he has just finished HD. Spoke with Freda Munro of the facility. Dorthey Sawyer, RN

## 2016-03-18 NOTE — Progress Notes (Signed)
Pt transferred to HD. Dorthey Sawyer, RN

## 2016-03-20 LAB — CULTURE, BLOOD (ROUTINE X 2)
CULTURE: NO GROWTH
Culture: NO GROWTH

## 2017-07-13 ENCOUNTER — Other Ambulatory Visit: Payer: Self-pay | Admitting: Radiology

## 2017-07-13 ENCOUNTER — Other Ambulatory Visit (HOSPITAL_COMMUNITY): Payer: Self-pay | Admitting: Nephrology

## 2017-07-13 DIAGNOSIS — Z992 Dependence on renal dialysis: Principal | ICD-10-CM

## 2017-07-13 DIAGNOSIS — N186 End stage renal disease: Secondary | ICD-10-CM

## 2017-07-14 ENCOUNTER — Encounter (HOSPITAL_COMMUNITY): Payer: Self-pay | Admitting: Interventional Radiology

## 2017-07-14 ENCOUNTER — Other Ambulatory Visit (HOSPITAL_COMMUNITY): Payer: Self-pay | Admitting: Nephrology

## 2017-07-14 ENCOUNTER — Ambulatory Visit (HOSPITAL_COMMUNITY)
Admission: RE | Admit: 2017-07-14 | Discharge: 2017-07-14 | Disposition: A | Discharge status: Home / Self Care | Payer: Medicare (Managed Care) | Source: Ambulatory Visit | Attending: Nephrology | Admitting: Nephrology

## 2017-07-14 DIAGNOSIS — Z950 Presence of cardiac pacemaker: Secondary | ICD-10-CM | POA: Insufficient documentation

## 2017-07-14 DIAGNOSIS — E785 Hyperlipidemia, unspecified: Secondary | ICD-10-CM | POA: Insufficient documentation

## 2017-07-14 DIAGNOSIS — Z7982 Long term (current) use of aspirin: Secondary | ICD-10-CM | POA: Insufficient documentation

## 2017-07-14 DIAGNOSIS — Z87891 Personal history of nicotine dependence: Secondary | ICD-10-CM | POA: Insufficient documentation

## 2017-07-14 DIAGNOSIS — N186 End stage renal disease: Secondary | ICD-10-CM | POA: Insufficient documentation

## 2017-07-14 DIAGNOSIS — Z992 Dependence on renal dialysis: Principal | ICD-10-CM

## 2017-07-14 DIAGNOSIS — T82868A Thrombosis of vascular prosthetic devices, implants and grafts, initial encounter: Secondary | ICD-10-CM | POA: Insufficient documentation

## 2017-07-14 DIAGNOSIS — Y832 Surgical operation with anastomosis, bypass or graft as the cause of abnormal reaction of the patient, or of later complication, without mention of misadventure at the time of the procedure: Secondary | ICD-10-CM | POA: Diagnosis not present

## 2017-07-14 DIAGNOSIS — Z79899 Other long term (current) drug therapy: Secondary | ICD-10-CM | POA: Insufficient documentation

## 2017-07-14 DIAGNOSIS — I120 Hypertensive chronic kidney disease with stage 5 chronic kidney disease or end stage renal disease: Secondary | ICD-10-CM | POA: Diagnosis not present

## 2017-07-14 HISTORY — PX: IR THROMBECTOMY AV FISTULA W/THROMBOLYSIS/PTA INC/SHUNT/IMG RIGHT: IMG6119

## 2017-07-14 HISTORY — PX: IR US GUIDE VASC ACCESS RIGHT: IMG2390

## 2017-07-14 LAB — BASIC METABOLIC PANEL
ANION GAP: 14 (ref 5–15)
BUN: 60 mg/dL — ABNORMAL HIGH (ref 6–20)
CALCIUM: 8.2 mg/dL — AB (ref 8.9–10.3)
CO2: 26 mmol/L (ref 22–32)
CREATININE: 7.32 mg/dL — AB (ref 0.61–1.24)
Chloride: 101 mmol/L (ref 101–111)
GFR calc Af Amer: 7 mL/min — ABNORMAL LOW (ref >60)
GFR, EST NON AFRICAN AMERICAN: 6 mL/min — AB (ref >60)
GLUCOSE: 111 mg/dL — AB (ref 65–99)
Potassium: 3.7 mmol/L (ref 3.5–5.1)
Sodium: 141 mmol/L (ref 135–145)

## 2017-07-14 LAB — CBC
HCT: 34.3 % — ABNORMAL LOW (ref 39.0–52.0)
HEMOGLOBIN: 10.7 g/dL — AB (ref 13.0–17.0)
MCH: 30.7 pg (ref 26.0–34.0)
MCHC: 31.2 g/dL (ref 30.0–36.0)
MCV: 98.6 fL (ref 78.0–100.0)
PLATELETS: 120 10*3/uL — AB (ref 150–400)
RBC: 3.48 MIL/uL — ABNORMAL LOW (ref 4.22–5.81)
RDW: 15.2 % (ref 11.5–15.5)
WBC: 5.6 10*3/uL (ref 4.0–10.5)

## 2017-07-14 LAB — PROTIME-INR
INR: 1.11
PROTHROMBIN TIME: 14.2 s (ref 11.4–15.2)

## 2017-07-14 LAB — GLUCOSE, CAPILLARY: Glucose-Capillary: 96 mg/dL (ref 65–99)

## 2017-07-14 MED ORDER — ALTEPLASE 2 MG IJ SOLR
INTRAMUSCULAR | Status: AC
  Filled 2017-07-14: qty 2

## 2017-07-14 MED ORDER — LIDOCAINE HCL 1 % IJ SOLN
INTRAMUSCULAR | Status: AC | PRN
  Administered 2017-07-14: 2 mL

## 2017-07-14 MED ORDER — MIDAZOLAM HCL 2 MG/2ML IJ SOLN
INTRAMUSCULAR | Status: AC | PRN
  Administered 2017-07-14 (×2): 0.5 mg via INTRAVENOUS

## 2017-07-14 MED ORDER — MIDAZOLAM HCL 2 MG/2ML IJ SOLN
INTRAMUSCULAR | Status: AC
  Filled 2017-07-14: qty 2

## 2017-07-14 MED ORDER — HEPARIN SODIUM (PORCINE) 1000 UNIT/ML IJ SOLN
INTRAMUSCULAR | Status: AC | PRN
  Administered 2017-07-14: 3000 [IU] via INTRAVENOUS

## 2017-07-14 MED ORDER — FENTANYL CITRATE (PF) 100 MCG/2ML IJ SOLN
INTRAMUSCULAR | Status: AC
  Filled 2017-07-14: qty 2

## 2017-07-14 MED ORDER — LIDOCAINE HCL 1 % IJ SOLN
INTRAMUSCULAR | Status: AC
  Filled 2017-07-14: qty 20

## 2017-07-14 MED ORDER — IOPAMIDOL (ISOVUE-300) INJECTION 61%
INTRAVENOUS | Status: AC
  Administered 2017-07-14: 30 mL
  Filled 2017-07-14: qty 100

## 2017-07-14 MED ORDER — FENTANYL CITRATE (PF) 100 MCG/2ML IJ SOLN
INTRAMUSCULAR | Status: AC | PRN
  Administered 2017-07-14: 25 ug via INTRAVENOUS

## 2017-07-14 MED ORDER — HEPARIN SODIUM (PORCINE) 1000 UNIT/ML IJ SOLN
INTRAMUSCULAR | Status: AC
  Filled 2017-07-14: qty 1

## 2017-07-14 NOTE — Sedation Documentation (Signed)
Patient is resting comfortably. 

## 2017-07-14 NOTE — Sedation Documentation (Signed)
Patient denies pain and is resting comfortably.  

## 2017-07-14 NOTE — H&P (Signed)
Chief Complaint: Patient was seen in consultation today for clotted (R)UE AVG at the request of Webb,Martin  Referring Physician(s): Webb,Martin  Supervising Physician: Marybelle Killings  Patient Status: [facility] - Out-pt  History of Present Illness: Robert Morgan is a 84 y.o. male with ESRD. He has HD on M_W_F via (R)UE AVG. He had normal HD on Monday, but when he went for treatment yesterday, graft was found to be clotted. He is referred for declot procedure. Last time this was done in 2017 with success. PMHx, meds, prior imaging reviewed. Has been NPO this am. No family at bedside, just driver from facility.  Past Medical History:  Diagnosis Date  . Anemia   . Atrial fibrillation ([place])   . Cancer ([place])    bladder -   . ESRD (end stage renal disease) on dialysis [facility])    "MWF; [place]" (03/16/2016)  . History of kidney stones   . Hyperlipidemia   . Hypertension   . Pacemaker     Past Surgical History:  Procedure Laterality Date  . APPENDECTOMY    . AV FISTULA PLACEMENT Right 10/01/2014   Procedure: INSERTION OF ARTERIOVENOUS (AV) GORE-TEX GRAFT RIGHT ARM;  Surgeon: Elam Dutch, MD;  Location: [facility];  Service: Vascular;  Laterality: Right;  . BLADDER REMOVAL    . CHOLECYSTECTOMY    . INSERT / REPLACE / REMOVE PACEMAKER    . IR GENERIC HISTORICAL Left 01/06/2016   IR THROMBECTOMY AV FISTULA W/THROMBOLYSIS/PTA INC/SHUNT/IMG LEFT 01/06/2016 [facility]-INTERV RAD  . IR GENERIC HISTORICAL  01/06/2016   IR US GUIDE VASC ACCESS RIGHT 01/06/2016 [facility]-INTERV RAD  . PERMANENT PACEMAKER INSERTION    . REVISION OF SCAR ON FACE/HEAD     due to wound   . TONSILLECTOMY    . TONSILLECTOMY      Allergies: Patient has no known allergies.  Medications: Prior to Admission medications   Medication Sig Start Date End Date Taking? Authorizing Provider  acetaminophen (TYLENOL) 325 MG tablet Take 650 mg by mouth every 6 (six) hours as needed for mild pain.    [provider]  aspirin  EC 81 MG tablet Take 81 mg by mouth daily.    [provider]  atorvastatin (LIPITOR) 10 MG tablet Take 10 mg by mouth daily.    [provider]  calcitRIOL (ROCALTROL) 0.25 MCG capsule Take 0.25 mcg by mouth daily.    [provider]  Cholecalciferol 1000 units tablet Take 1,000 Units by mouth daily.    [provider]  darbepoetin (ARANESP) 60 MCG/0.3ML SOLN injection Inject 60 mcg into the skin every 7 (seven) days.    [provider]  diltiazem (TIAZAC) 120 MG 24 hr capsule Take 120 mg by mouth daily.    [provider]  ENSURE (ENSURE) Take 237 mLs by mouth daily.    [provider]  metoprolol tartrate (LOPRESSOR) 25 MG tablet Take 12.5 mg by mouth 2 (two) times daily.     [provider]  multivitamin (RENA-VIT) TABS tablet Take 1 tablet by mouth at bedtime. 11/13/14   Mikhail, Velta Addison, DO  omeprazole (PRILOSEC) 40 MG capsule Take 40 mg by mouth daily.    [provider]  QUEtiapine (SEROQUEL) 50 MG tablet Take 50 mg by mouth 2 (two) times daily.    [provider]  ranitidine (ZANTAC) 150 MG tablet Take 150 mg by mouth daily.    [provider]  sevelamer carbonate (RENVELA) 800 MG tablet Take 1,600 mg by  mouth 3 (three) times daily with meals.    [provider]  temazepam (RESTORIL) 15 MG capsule Take 15 mg by mouth at bedtime as needed for sleep.    [provider]  traZODone (DESYREL) 50 MG tablet Take 50 mg by mouth at bedtime.    [provider]  triamcinolone cream (KENALOG) 0.1 % Apply 1 application topically 2 (two) times daily.    [provider]     Family History  Problem Relation Age of Onset  . Stroke Mother   . Hypertension Mother   . Varicose Veins Mother   . Heart disease Mother   . Heart attack Father     Social History   Socioeconomic History  . Marital status: Married    Spouse name: Not on file  . Number of children: Not on  file  . Years of education: Not on file  . Highest education level: Not on file  Occupational History  . Not on file  Social Needs  . Financial resource strain: Not on file  . Food insecurity:    Worry: Not on file    Inability: Not on file  . Transportation needs:    Medical: Not on file    Non-medical: Not on file  Tobacco Use  . Smoking status: Former Smoker    Last attempt to quit: 04/19/1980    Years since quitting: 37.2  Substance and Sexual Activity  . Alcohol use: Yes    Alcohol/week: 0.0 oz  . Drug use: No  . Sexual activity: Never    Birth control/protection: None  Lifestyle  . Physical activity:    Days per week: Not on file    Minutes per session: Not on file  . Stress: Not on file  Relationships  . Social connections:    Talks on phone: Not on file    Gets together: Not on file    Attends religious service: Not on file    Active member of club or organization: Not on file    Attends meetings of clubs or organizations: Not on file    Relationship status: Not on file  Other Topics Concern  . Not on file  Social History Narrative  . Not on file     Review of Systems: A 12 point ROS discussed and pertinent positives are indicated in the HPI above.  All other systems are negative.  Review of Systems  Vital Signs: BP 140/81   Pulse 82   Temp (!) 97.3 F (36.3 C) (Oral)   Resp 16   Ht 5\' 1"  (1.549 m)   Wt 162 lb (73.5 kg)   SpO2 95%   BMI 30.61 kg/m   Physical Exam  Constitutional: He is oriented to person, place, and time. He appears well-developed. No distress.  HENT:  Head: Normocephalic.  Mouth/Throat: Oropharynx is clear and moist.  Neck: No JVD present. No tracheal deviation present.  Cardiovascular: Normal rate, regular rhythm and normal heart sounds.  Pulmonary/Chest: Effort normal and breath sounds normal. No respiratory distress.  Musculoskeletal:  (R)UE AVG palpable, no pulse/thrill. Hand warm, good radial pulse.  Neurological: He  is alert and oriented to person, place, and time.  Skin: Skin is warm and dry.     Imaging: No results found.  Labs:  CBC: No results for input(s): WBC, HGB, HCT, PLT in the last 8760 hours.  COAGS: No results for input(s): INR, APTT in the last 8760 hours.  BMP: No results for input(s): NA,  K, CL, CO2, GLUCOSE, BUN, CALCIUM, CREATININE, GFRNONAA, GFRAA in the last 8760 hours.  Invalid input(s): CMP  Assessment and Plan: Clotted (R)UE AVG Discussed thrombolysis/thrombectomy of AV Graft/Fistula, possible angioplasty, possible stent, possible HD catheter placement if necessary. Risks, benefits, use of sedation thoroughly explained.   Thank you for this interesting consult.  I greatly enjoyed meeting Robert Morgan and look forward to participating in their care.  A copy of this report was sent to the requesting provider on this date.  Electronically Signed: Ascencion Dike, PA-C 07/14/2017, 9:12 AM   I spent a total of 20 minutes in face to face in clinical consultation, greater than 50% of which was counseling/coordinating care for declot of AVG

## 2017-07-14 NOTE — Discharge Instructions (Signed)

## 2017-07-14 NOTE — Procedures (Signed)
RUE AVG declot PTA venous 7 mm EBL 0 Comp 0

## 2017-08-23 ENCOUNTER — Encounter: Payer: Self-pay | Admitting: Vascular Surgery

## 2017-08-23 ENCOUNTER — Ambulatory Visit (INDEPENDENT_AMBULATORY_CARE_PROVIDER_SITE_OTHER): Payer: Medicare (Managed Care) | Admitting: Vascular Surgery

## 2017-08-23 VITALS — BP 110/62 | HR 72 | Temp 97.5°F | Resp 16 | Ht 69.0 in | Wt 157.0 lb

## 2017-08-23 DIAGNOSIS — Z992 Dependence on renal dialysis: Secondary | ICD-10-CM | POA: Diagnosis not present

## 2017-08-23 DIAGNOSIS — N186 End stage renal disease: Secondary | ICD-10-CM

## 2017-08-23 NOTE — Progress Notes (Signed)
[facility]ist of [place]  Patient name: Robert Morgan MRN: [id number] DOB: 1927-11-24 Sex: male  REASON FOR VISIT: Evaluation of right upper arm AV graft  HPI: Robert Morgan is a 84 y.o. male here today for evaluation of right upper arm AV graft.  This was initially placed by Dr. Oneida Alar in June 2016.  He has had multiple vascular revisions both at [facility] and [facility].  There was concern regarding thinning at the midportion of his graft and he is seen today for further evaluation of this.  There is no note that he is having any difficulty with access or with flow.  Past Medical History:  Diagnosis Date  . Anemia   . Atrial fibrillation ([place])   . Cancer ([place])    bladder -   . ESRD (end stage renal disease) on dialysis [facility])    "MWF; [place]" (03/16/2016)  . History of kidney stones   . Hyperlipidemia   . Hypertension   . Pacemaker     Family History  Problem Relation Age of Onset  . Stroke Mother   . Hypertension Mother   . Varicose Veins Mother   . Heart disease Mother   . Heart attack Father     SOCIAL HISTORY: Social History   Tobacco Use  . Smoking status: Former Smoker    Last attempt to quit: 04/19/1980    Years since quitting: 37.3  Substance Use Topics  . Alcohol use: Yes    Alcohol/week: 0.0 oz    No Known Allergies  Current Outpatient Medications  Medication Sig Dispense Refill  . atorvastatin (LIPITOR) 10 MG tablet Take 10 mg by mouth daily.    . Cholecalciferol 1000 units tablet Take 1,000 Units by mouth daily.    Marland Kitchen dronabinol (MARINOL) 2.5 MG capsule Take 2.5 mg by mouth 2 (two) times daily before a meal.    . midodrine (PROAMATINE) 2.5 MG tablet Take 2.5 mg by mouth daily as needed (hypotension).     . multivitamin (RENA-VIT) TABS tablet Take 1 tablet by mouth at bedtime. 30 tablet 0  . pravastatin (PRAVACHOL) 40 MG tablet Take 40 mg by mouth daily.    . QUEtiapine (SEROQUEL) 25 MG  tablet Take 25 mg by mouth at bedtime.     . ranitidine (ZANTAC) 150 MG tablet Take 150 mg by mouth daily.    . sevelamer carbonate (RENVELA) 800 MG tablet Take 2,400 mg by mouth 3 (three) times daily with meals.     . Amino Acids-Protein Hydrolys (FEEDING SUPPLEMENT, PRO-STAT SUGAR FREE 64,) LIQD Take 30 mLs by mouth 2 (two) times daily.    Marland Kitchen dextromethorphan (DELSYM) 30 MG/5ML liquid Take 60 mg by mouth every 6 (six) hours as needed for cough.    . metoprolol succinate (TOPROL-XL) 25 MG 24 hr tablet Take 12.5 mg by mouth daily.    . mirtazapine (REMERON) 15 MG tablet Take 7.5 mg by mouth at bedtime.    . Nutritional Supplements (RESOURCE 2.0) LIQD Take 120 mLs by mouth 2 (two) times daily.    . traZODone (DESYREL) 50 MG tablet Take 25 mg by mouth at bedtime.      No current facility-administered medications for this visit.     REVIEW OF SYSTEMS:  [X]  denotes positive finding, [ ]  denotes negative finding Cardiac  Comments:  Chest pain or chest pressure:    Shortness of breath upon exertion:    Short of breath when lying flat:  Irregular heart rhythm:        Vascular    Pain in calf, thigh, or hip brought on by ambulation:    Pain in feet at night that wakes you up from your sleep:     Blood clot in your veins:    Leg swelling:           PHYSICAL EXAM: Vitals:   08/23/17 1539  BP: 110/62  Pulse: 72  Resp: 16  Temp: (!) 97.5 F (36.4 C)  SpO2: 91%  Weight: 157 lb (71.2 kg)  Height: 5\' 9"  (1.753 m)    GENERAL: The patient is a well-nourished male, in no acute distress. The vital signs are documented above. CARDIOVASCULAR: He does have a good thrill in his upper arm AV graft.  There is one area that is less than a centimeter in diameter which appears to be a small false aneurysm.  He has no evidence of skin breakdown over his AV graft PULMONARY: There is good air exchange  MUSCULOSKELETAL: There are no major deformities or cyanosis. NEUROLOGIC: No focal weakness or  paresthesias are detected. SKIN: There are no ulcers or rashes noted. PSYCHIATRIC: The patient has a normal affect.  DATA:  None  MEDICAL ISSUES: I discussed the significance of this with the patient and also with his caregiver from the nursing facility.  I would not recommend any surgical revision.  This would essentially require replacement of his graft.  He does have a left side pacemaker and therefore would be difficult to have further access other than a new access on the right side.  He has had multiple interventions in the venous anastomosis and therefore would have a difficult upper extremity graft placement.  I would continue use of this.  Would keep an eye on this for skin breakdown.  He does appear to have adequate room above and below this area so would avoid direct access stick into this specific area.  He will see [place] again on an as-needed basis    Rosetta Posner, MD [facility] Vein Specialists of [facility] Tel [phone] Pager [phone]

## 2017-08-25 ENCOUNTER — Encounter: Payer: Self-pay | Admitting: Nephrology

## 2017-11-17 DEATH — deceased

## 2018-01-09 IMAGING — CR DG CHEST 1V PORT
1 series · 1 of 1 positions shown · non-contrast
Comparison: Prior radiograph from 08/26/2015.

CLINICAL DATA: Initial evaluation for mild shortness of breath.
History of hypertension.

EXAM:
PORTABLE CHEST 1 VIEW

[AP]
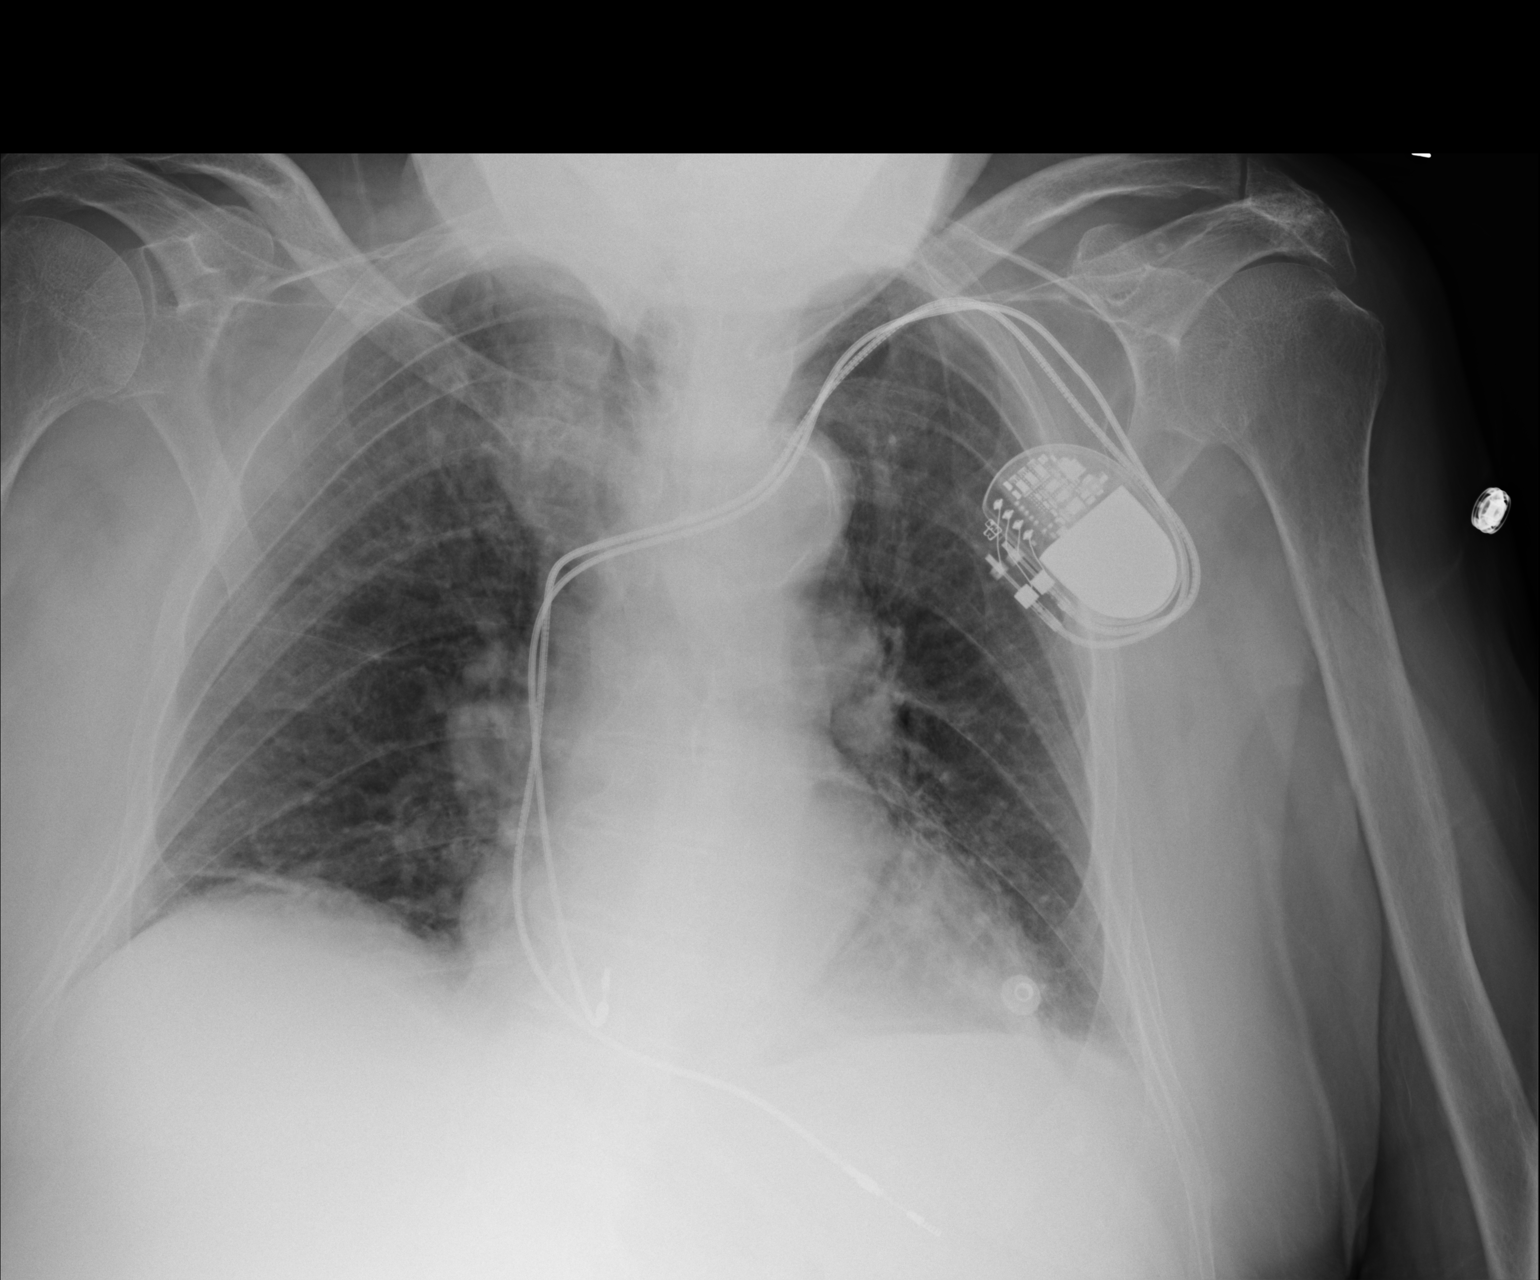

[1 of 1 positions shown; findings below may reference images not displayed]

FINDINGS: Pool deep left-sided pacemaker/ AICD with electrodes overlying the
right atrium right ventricle in place, stable. Mild cardiomegaly is
unchanged. Mediastinal silhouette within normal limits. Aortic
atherosclerosis noted.

Lungs are hypoinflated. Patchy and linear bibasilar opacities felt
to most likely reflect atelectasis. There is mild diffuse pulmonary
vascular congestion with fullness of the interstitial markings,
suspicious for possible early and/ or developing edema. No focal
infiltrates. No pleural effusion. No pneumothorax.

No acute osseous abnormality.
IMPRESSION: 1. Diffuse pulmonary vascular congestion with fullness of the
interstitial markings, suspicious for early and/or developing
pulmonary interstitial edema.
2. Superimposed mild bibasilar atelectasis.
3. Aortic atherosclerosis.

## 2019-05-10 IMAGING — US IR US GUIDE VASC ACCESS RIGHT
1 series · 2 of 2 positions shown · non-contrast
Comparison: none

INDICATION: Thrombosed AVG. Right upper extremity

[Series 1: ir us guide vasc access right · 2 of 2 slices shown]
[im 1/2]
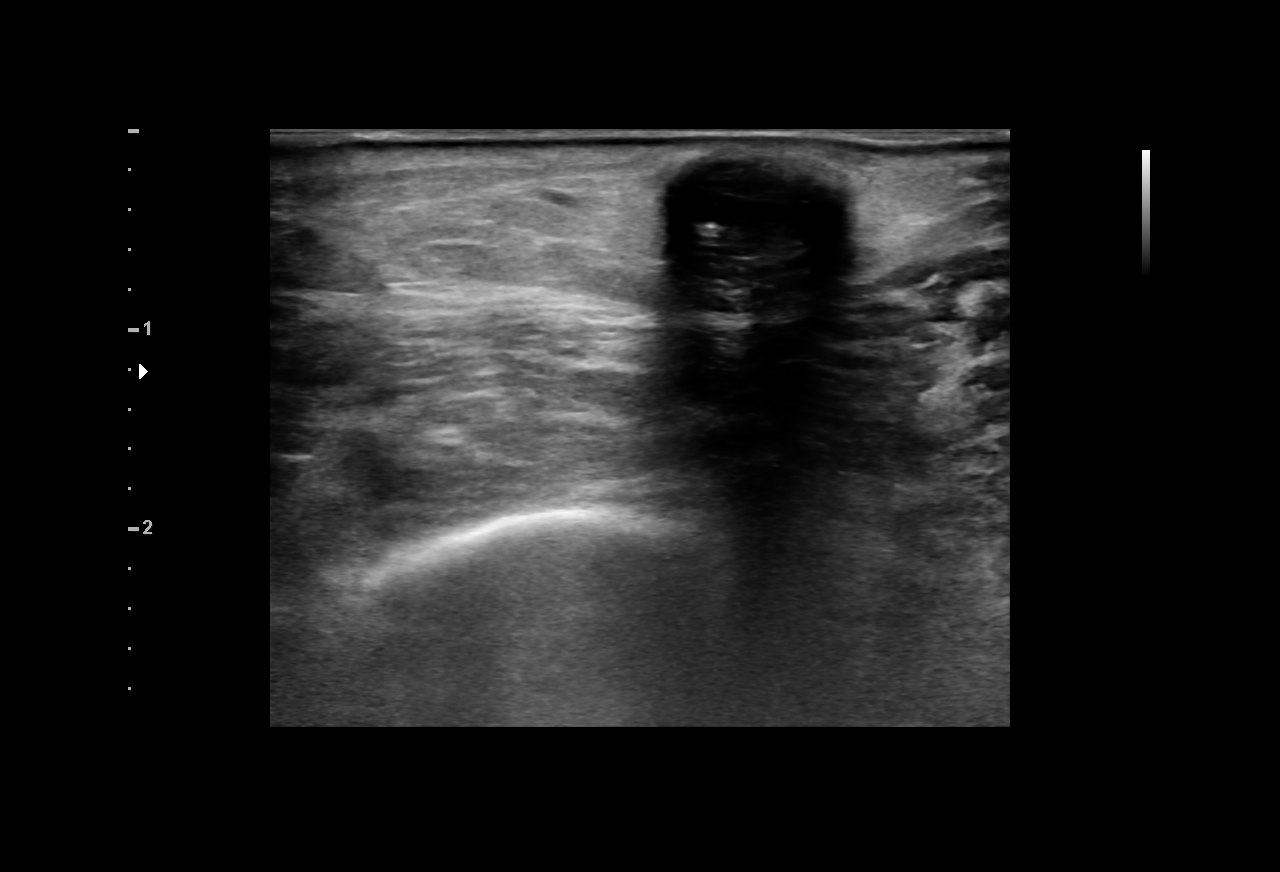
[im 2/2]
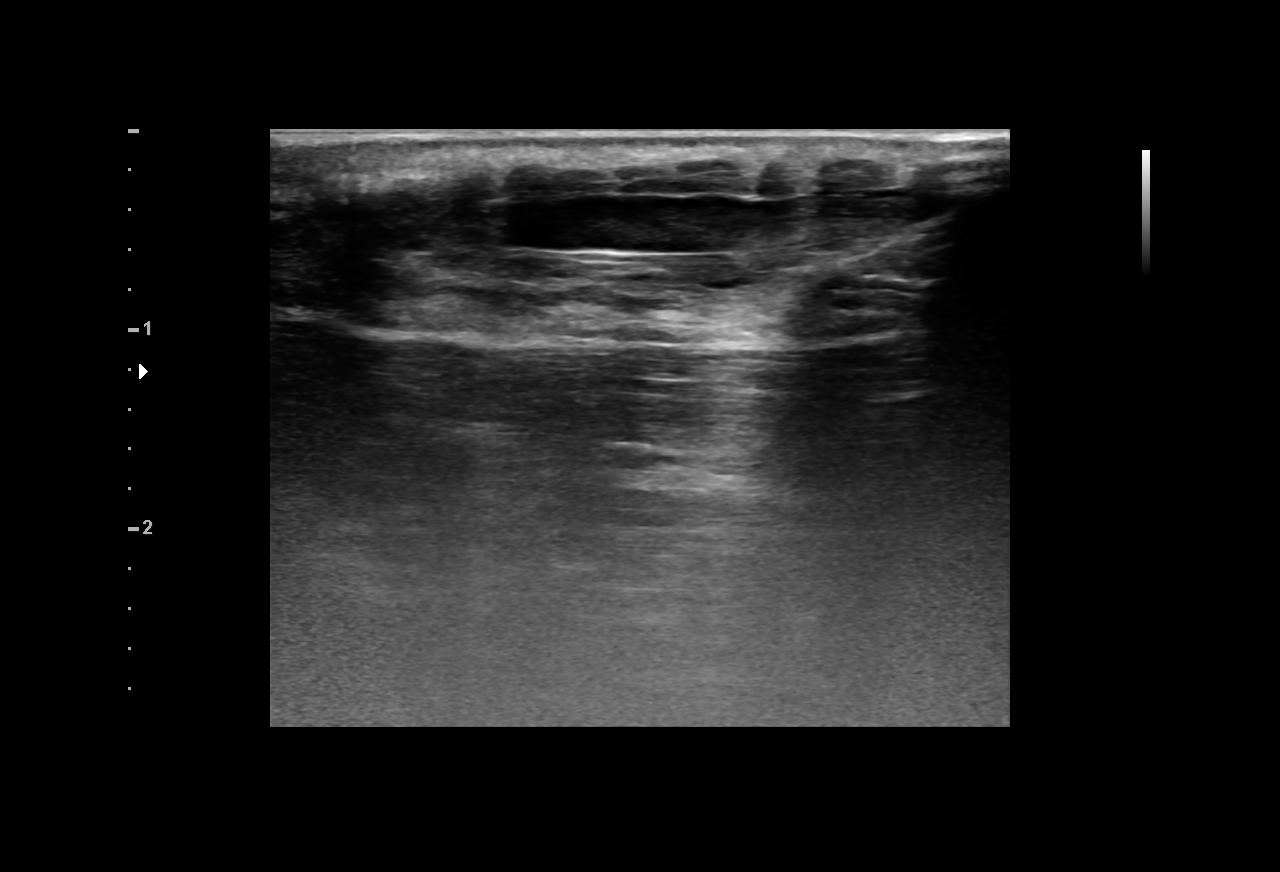

[2 of 2 positions shown; findings below may reference images not displayed]

EXAM:
DIALYSIS GRAFT THROMBECTOMY, ULTRASOUND VENOUS ACCESS, PTA VENOUS

MEDICATIONS:
TPA 2 mg. Heparin 2555 units intravenous.

ANESTHESIA/SEDATION:
Moderate Sedation Time:  32 minutes

The patient was continuously monitored during the procedure by the
interventional radiology nurse under my direct supervision.

FLUOROSCOPY TIME:  Fluoroscopy Time: 7 minutes  seconds (8 mGy).

COMPLICATIONS:
None immediate.

PROCEDURE:
Informed written consent was obtained from the patient after a
thorough discussion of the procedural risks, benefits and
alternatives. All questions were addressed. Maximal Sterile Barrier
Technique was utilized including caps, mask, sterile gowns, sterile
gloves, sterile drape, hand hygiene and skin antiseptic. A timeout
was performed prior to the initiation of the procedure.

The right arm was prepped with ChloraPrep in a sterile fashion, and
a sterile drape was applied covering the operative field. A sterile
gown and sterile gloves were used for the procedure.

The graft was noted to be occluded initially with ultrasound. Under
sonographic guidance, a micropuncture needle was inserted into the
graft (Ultrasound image documentation was performed) directed
towards the venous anastomosis. It was removed over a 018 wire. A
5-French transitional dilator was inserted. 1 mg t-PA was injected.
The dilator was exchanged over a Benson wire for a 6-French sheath.
The identical procedure was performed towards the arterial
anastomosis.

The Kumpe catheter was utilized to advance the Kumpe catheter across
both the arterial and venous anastomoses. Central venography was
performed.

A 7 mm angioplasty device was utilized to dilate the venous limb of
the graft, the venous anastomosis. The Fogarty balloon was then
utilized to sweep the arterial and venous limbs of the graft. Final
imaging was performed. Sheaths were removed and hemostasis was
achieved with two figure-of-eight O Prolene stitches.
IMPRESSION: Successful right upper extremity AV graft thrombectomy and
dilatation of the venous anastomosis to 7 mm.
